# Patient Record
Sex: Female | Born: 1990 | Race: Black or African American | Hispanic: No | Marital: Single | Smoking: Former smoker
Health system: Southern US, Community
[De-identification: ages and names within clinical notes are randomized; demographics above are authoritative.]

## PROBLEM LIST (undated history)

## (undated) ENCOUNTER — Inpatient Hospital Stay (HOSPITAL_COMMUNITY): Payer: Self-pay

## (undated) DIAGNOSIS — N760 Acute vaginitis: Secondary | ICD-10-CM

## (undated) DIAGNOSIS — N926 Irregular menstruation, unspecified: Secondary | ICD-10-CM

## (undated) DIAGNOSIS — IMO0001 Reserved for inherently not codable concepts without codable children: Secondary | ICD-10-CM

## (undated) DIAGNOSIS — A539 Syphilis, unspecified: Secondary | ICD-10-CM

## (undated) DIAGNOSIS — Z8742 Personal history of other diseases of the female genital tract: Secondary | ICD-10-CM

## (undated) DIAGNOSIS — R102 Pelvic and perineal pain: Secondary | ICD-10-CM

## (undated) DIAGNOSIS — L299 Pruritus, unspecified: Secondary | ICD-10-CM

## (undated) DIAGNOSIS — Z8619 Personal history of other infectious and parasitic diseases: Secondary | ICD-10-CM

## (undated) DIAGNOSIS — B9689 Other specified bacterial agents as the cause of diseases classified elsewhere: Secondary | ICD-10-CM

## (undated) DIAGNOSIS — R634 Abnormal weight loss: Secondary | ICD-10-CM

## (undated) DIAGNOSIS — IMO0002 Reserved for concepts with insufficient information to code with codable children: Secondary | ICD-10-CM

## (undated) DIAGNOSIS — B977 Papillomavirus as the cause of diseases classified elsewhere: Secondary | ICD-10-CM

## (undated) HISTORY — DX: Other specified bacterial agents as the cause of diseases classified elsewhere: B96.89

## (undated) HISTORY — DX: Papillomavirus as the cause of diseases classified elsewhere: B97.7

## (undated) HISTORY — DX: Reserved for concepts with insufficient information to code with codable children: IMO0002

## (undated) HISTORY — DX: Reserved for inherently not codable concepts without codable children: IMO0001

## (undated) HISTORY — DX: Acute vaginitis: N76.0

## (undated) HISTORY — DX: Personal history of other diseases of the female genital tract: Z87.42

## (undated) HISTORY — DX: Abnormal weight loss: R63.4

## (undated) HISTORY — DX: Pruritus, unspecified: L29.9

## (undated) HISTORY — DX: Pelvic and perineal pain: R10.2

## (undated) HISTORY — DX: Personal history of other infectious and parasitic diseases: Z86.19

## (undated) HISTORY — DX: Syphilis, unspecified: A53.9

## (undated) HISTORY — DX: Irregular menstruation, unspecified: N92.6

---
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
## 2009-01-15 DIAGNOSIS — IMO0002 Reserved for concepts with insufficient information to code with codable children: Secondary | ICD-10-CM

## 2009-01-15 DIAGNOSIS — R87619 Unspecified abnormal cytological findings in specimens from cervix uteri: Secondary | ICD-10-CM

## 2009-01-15 HISTORY — DX: Unspecified abnormal cytological findings in specimens from cervix uteri: R87.619

## 2009-01-15 HISTORY — DX: Reserved for concepts with insufficient information to code with codable children: IMO0002

## 2009-06-05 DIAGNOSIS — Z8619 Personal history of other infectious and parasitic diseases: Secondary | ICD-10-CM

## 2009-06-05 HISTORY — DX: Personal history of other infectious and parasitic diseases: Z86.19

## 2009-09-04 DIAGNOSIS — L299 Pruritus, unspecified: Secondary | ICD-10-CM

## 2009-09-04 HISTORY — DX: Pruritus, unspecified: L29.9

## 2010-06-22 ENCOUNTER — Inpatient Hospital Stay (HOSPITAL_COMMUNITY): Admission: AD | Admit: 2010-06-22 | Discharge: 2010-06-22 | Payer: Self-pay | Admitting: Obstetrics and Gynecology

## 2010-07-26 ENCOUNTER — Inpatient Hospital Stay (HOSPITAL_COMMUNITY): Admission: AD | Admit: 2010-07-26 | Discharge: 2010-07-26 | Payer: Self-pay | Admitting: Obstetrics and Gynecology

## 2010-09-09 ENCOUNTER — Inpatient Hospital Stay (HOSPITAL_COMMUNITY): Admission: AD | Admit: 2010-09-09 | Discharge: 2010-09-09 | Payer: Self-pay | Admitting: Obstetrics and Gynecology

## 2010-09-10 ENCOUNTER — Inpatient Hospital Stay (HOSPITAL_COMMUNITY): Admission: AD | Admit: 2010-09-10 | Discharge: 2010-09-10 | Payer: Self-pay | Admitting: Obstetrics and Gynecology

## 2010-09-11 ENCOUNTER — Encounter (INDEPENDENT_AMBULATORY_CARE_PROVIDER_SITE_OTHER): Payer: Self-pay | Admitting: Obstetrics and Gynecology

## 2010-09-11 ENCOUNTER — Inpatient Hospital Stay (HOSPITAL_COMMUNITY): Admission: AD | Admit: 2010-09-11 | Discharge: 2010-09-13 | Payer: Self-pay | Admitting: Obstetrics and Gynecology

## 2011-02-27 LAB — COMPREHENSIVE METABOLIC PANEL
ALT: 28 U/L (ref 0–35)
Albumin: 3.1 g/dL — ABNORMAL LOW (ref 3.5–5.2)
CO2: 22 mEq/L (ref 19–32)
Creatinine, Ser: 0.61 mg/dL (ref 0.4–1.2)
GFR calc Af Amer: >60 mL/min (ref >60)
GFR calc non Af Amer: >60 mL/min (ref >60)
Sodium: 135 mEq/L (ref 135–145)
Total Protein: 5.7 g/dL — ABNORMAL LOW (ref 6.0–8.3)

## 2011-02-27 LAB — URINE CULTURE
Colony Count: NO GROWTH
Culture: NO GROWTH

## 2011-02-27 LAB — DIFFERENTIAL
Basophils Relative: 0 % (ref 0–1)
Eosinophils Relative: 1 % (ref 0–5)
Lymphocytes Relative: 7 % — ABNORMAL LOW (ref 12–46)
Lymphs Abs: 0.9 10*3/uL (ref 0.7–4.0)
Monocytes Relative: 9 % (ref 3–12)

## 2011-02-27 LAB — CBC
HCT: 27.6 % — ABNORMAL LOW (ref 36.0–46.0)
HCT: 33.8 % — ABNORMAL LOW (ref 36.0–46.0)
HCT: 34.1 % — ABNORMAL LOW (ref 36.0–46.0)
Hemoglobin: 9.6 g/dL — ABNORMAL LOW (ref 12.0–15.0)
MCH: 32.3 pg (ref 26.0–34.0)
MCH: 32.4 pg (ref 26.0–34.0)
MCH: 33.1 pg (ref 26.0–34.0)
MCHC: 34.7 g/dL (ref 30.0–36.0)
MCV: 94.9 fL (ref 78.0–100.0)
MCV: 95 fL (ref 78.0–100.0)
MCV: 95.6 fL (ref 78.0–100.0)
Platelets: 117 10*3/uL — ABNORMAL LOW (ref 150–400)
Platelets: 151 10*3/uL (ref 150–400)
RBC: 2.89 MIL/uL — ABNORMAL LOW (ref 3.87–5.11)
RDW: 13.2 % (ref 11.5–15.5)
RDW: 13.3 % (ref 11.5–15.5)

## 2011-02-27 LAB — URINALYSIS, ROUTINE W REFLEX MICROSCOPIC
Glucose, UA: NEGATIVE mg/dL
Protein, ur: NEGATIVE mg/dL
Urobilinogen, UA: 1 mg/dL (ref 0.0–1.0)

## 2011-02-27 LAB — WET PREP, GENITAL: Yeast Wet Prep HPF POC: NONE SEEN

## 2011-02-28 LAB — GC/CHLAMYDIA PROBE AMP, GENITAL
Chlamydia, DNA Probe: NEGATIVE
GC Probe Amp, Genital: NEGATIVE

## 2011-02-28 LAB — URINALYSIS, ROUTINE W REFLEX MICROSCOPIC
Bilirubin Urine: NEGATIVE
Hgb urine dipstick: NEGATIVE
Nitrite: NEGATIVE

## 2011-02-28 LAB — URINE MICROSCOPIC-ADD ON

## 2011-02-28 LAB — WET PREP, GENITAL
Trich, Wet Prep: NONE SEEN
Yeast Wet Prep HPF POC: NONE SEEN

## 2011-02-28 LAB — STREP B DNA PROBE: Strep Group B Ag: NEGATIVE

## 2011-03-02 LAB — WET PREP, GENITAL: Yeast Wet Prep HPF POC: NONE SEEN

## 2011-03-02 LAB — URINALYSIS, ROUTINE W REFLEX MICROSCOPIC
Ketones, ur: NEGATIVE mg/dL
Nitrite: NEGATIVE
pH: 6 (ref 5.0–8.0)

## 2011-03-02 LAB — URINE MICROSCOPIC-ADD ON

## 2011-03-02 LAB — GC/CHLAMYDIA PROBE AMP, GENITAL: GC Probe Amp, Genital: NEGATIVE

## 2011-09-08 LAB — URINE MICROSCOPIC-ADD ON

## 2011-09-08 LAB — URINALYSIS, ROUTINE W REFLEX MICROSCOPIC
Glucose, UA: NEGATIVE
Ketones, ur: NEGATIVE
Protein, ur: NEGATIVE

## 2011-09-08 LAB — CBC
HCT: 33.1 — ABNORMAL LOW
Hemoglobin: 11.3 — ABNORMAL LOW
MCHC: 34
RBC: 3.61 — ABNORMAL LOW
RDW: 12.5
WBC: 8.8

## 2011-09-08 LAB — DIFFERENTIAL
Basophils Absolute: 0
Basophils Relative: 0
Eosinophils Absolute: 0
Monocytes Relative: 6
Neutrophils Relative %: 86 — ABNORMAL HIGH

## 2011-09-08 LAB — BASIC METABOLIC PANEL
BUN: 8
CO2: 22
Glucose, Bld: 121 — ABNORMAL HIGH
Potassium: 3.8

## 2012-03-04 ENCOUNTER — Ambulatory Visit: Payer: Self-pay | Admitting: Obstetrics and Gynecology

## 2012-04-01 ENCOUNTER — Emergency Department (HOSPITAL_COMMUNITY)
Admission: EM | Admit: 2012-04-01 | Discharge: 2012-04-01 | Disposition: A | Discharge status: Home / Self Care | Payer: Managed Care, Other (non HMO) | Attending: Emergency Medicine | Admitting: Emergency Medicine

## 2012-04-01 ENCOUNTER — Encounter (HOSPITAL_COMMUNITY): Payer: Self-pay | Admitting: Emergency Medicine

## 2012-04-01 DIAGNOSIS — R1110 Vomiting, unspecified: Secondary | ICD-10-CM | POA: Insufficient documentation

## 2012-04-01 DIAGNOSIS — N946 Dysmenorrhea, unspecified: Secondary | ICD-10-CM | POA: Insufficient documentation

## 2012-04-01 DIAGNOSIS — R109 Unspecified abdominal pain: Secondary | ICD-10-CM | POA: Insufficient documentation

## 2012-04-01 LAB — BASIC METABOLIC PANEL
CO2: 25 mEq/L (ref 19–32)
GFR calc non Af Amer: >90 mL/min (ref >90)
Glucose, Bld: 88 mg/dL (ref 70–99)
Potassium: 3.8 mEq/L (ref 3.5–5.1)
Sodium: 139 mEq/L (ref 135–145)

## 2012-04-01 LAB — URINALYSIS, ROUTINE W REFLEX MICROSCOPIC
Nitrite: NEGATIVE
Specific Gravity, Urine: 1.028 (ref 1.005–1.030)
pH: 7.5 (ref 5.0–8.0)

## 2012-04-01 LAB — CBC
Platelets: 245 10*3/uL (ref 150–400)
RBC: 3.9 MIL/uL (ref 3.87–5.11)
WBC: 5.7 10*3/uL (ref 4.0–10.5)

## 2012-04-01 LAB — DIFFERENTIAL
Eosinophils Absolute: 0.1 10*3/uL (ref 0.0–0.7)
Lymphocytes Relative: 43 % (ref 12–46)
Lymphs Abs: 2.4 10*3/uL (ref 0.7–4.0)
Neutrophils Relative %: 47 % (ref 43–77)

## 2012-04-01 LAB — URINE MICROSCOPIC-ADD ON

## 2012-04-01 LAB — PREGNANCY, URINE: Preg Test, Ur: NEGATIVE

## 2012-04-01 MED ORDER — CIPROFLOXACIN HCL 500 MG PO TABS: 500.0000 mg | ORAL_TABLET | Freq: Two times a day (BID) | ORAL | Status: AC

## 2012-04-01 MED ORDER — ONDANSETRON 4 MG PO TBDP
4.0000 mg | ORAL_TABLET | Freq: Once | ORAL | Status: AC
  Administered 2012-04-01: 4 mg via ORAL
  Filled 2012-04-01: qty 1

## 2012-04-01 MED ORDER — OXYCODONE-ACETAMINOPHEN 5-325 MG PO TABS
1.0000 | ORAL_TABLET | Freq: Once | ORAL | Status: AC
  Administered 2012-04-01: 1 via ORAL
  Filled 2012-04-01: qty 1

## 2012-04-01 MED ORDER — ONDANSETRON 4 MG PO TBDP: 4.0000 mg | ORAL_TABLET | Freq: Three times a day (TID) | ORAL | Status: AC | PRN

## 2012-04-01 MED ORDER — OXYCODONE-ACETAMINOPHEN 5-325 MG PO TABS: 1.0000 | ORAL_TABLET | ORAL | Status: AC | PRN

## 2012-04-01 NOTE — Discharge Instructions (Signed)
Abdominal Pain Abdominal pain can be caused by many things. Your caregiver decides the seriousness of your pain by an examination and possibly blood tests and X-rays. Many cases can be observed and treated at home. Most abdominal pain is not caused by a disease and will probably improve without treatment. However, in many cases, more time must pass before a clear cause of the pain can be found. Before that point, it may not be known if you need more testing, or if hospitalization or surgery is needed. HOME CARE INSTRUCTIONS   Do not take laxatives unless directed by your caregiver.   Take pain medicine only as directed by your caregiver.   Only take over-the-counter or prescription medicines for pain, discomfort, or fever as directed by your caregiver.   Try a clear liquid diet (broth, tea, or water) for as long as directed by your caregiver. Slowly move to a bland diet as tolerated.  SEEK IMMEDIATE MEDICAL CARE IF:   The pain does not go away.   You have a fever.   You keep throwing up (vomiting).   The pain is felt only in portions of the abdomen. Pain in the right side could possibly be appendicitis. In an adult, pain in the left lower portion of the abdomen could be colitis or diverticulitis.   You pass bloody or black tarry stools.  MAKE SURE YOU:   Understand these instructions.   Will watch your condition.   Will get help right away if you are not doing well or get worse.  Document Released: 09/10/2005 Document Revised: 11/20/2011 Document Reviewed: 07/19/2008 ExitCare Patient Information 2012 ExitCare, LLCDysmenorrhea Menstrual pain is caused by the muscles of the uterus tightening (contracting) during a menstrual period. The muscles of the uterus contract due to the chemicals in the uterine lining. Primary dysmenorrhea is menstrual cramps that last a couple of days when you start having menstrual periods or soon after. This often begins after a teenager starts having her  period. As a woman gets older or has a baby, the cramps will usually lesson or disappear. Secondary dysmenorrhea begins later in life, lasts longer, and the pain may be stronger than primary dysmenorrhea. The pain may start before the period and last a few days after the period. This type of dysmenorrhea is usually caused by an underlying problem such as:  The tissue lining the uterus grows outside of the uterus in other areas of the body (endometriosis).   The endometrial tissue, which normally lines the uterus, is found in or grows into the muscular walls of the uterus (adenomyosis).   The pelvic blood vessels are engorged with blood just before the menstrual period (pelvic congestive syndrome).   Overgrowth of cells in the lining of the uterus or cervix (polyps of the uterus or cervix).   Falling down of the uterus (prolapse) because of loose or stretched ligaments.   Depression.   Bladder problems, infection, or inflammation.   Problems with the intestine, a tumor, or irritable bowel syndrome.   Cancer of the female organs or bladder.   A severely tipped uterus.   A very tight opening or closed cervix.   Noncancerous tumors of the uterus (fibroids).   Pelvic inflammatory disease (PID).   Pelvic scarring (adhesions) from a previous surgery.   Ovarian cyst.   An intrauterine device (IUD) used for birth control.  CAUSES  The cause of menstrual pain is often unknown. SYMPTOMS   Cramping or throbbing pain in your lower abdomen.  Sometimes, a woman may also experience headaches.   Lower back pain.   Feeling sick to your stomach (nausea) or vomiting.   Diarrhea.   Sweating or dizziness.  DIAGNOSIS  A diagnosis is based on your history, symptoms, physical examination, diagnostic tests, or procedures. Diagnostic tests or procedures may include:  Blood tests.   An ultrasound.   An examination of the lining of the uterus (dilation and curettage, D&C).   An  examination inside your abdomen or pelvis with a scope (laparoscopy).   X-rays.   CT Scan.   MRI.   An examination inside the bladder with a scope (cystoscopy).   An examination inside the intestine or stomach with a scope (colonoscopy, gastroscopy).  TREATMENT  Treatment depends on the cause of the dysmenorrhea. Treatment may include:  Pain medicine prescribed by your caregiver.   Birth control pills.   Hormone replacement therapy.   Nonsteroidal anti-inflammatory drugs (NSAIDs). These may help stop the production of prostaglandins.   An IUD with progesterone hormone in it.   Acupuncture.   Surgery to remove adhesions, endometriosis, ovarian cyst, or fibroids.   Removal of the uterus (hysterectomy).   Progesterone shots to stop the menstrual period.   Cutting the nerves on the sacrum that go to the female organs (presacral neurectomy).   Electric currant to the sacral nerves (sacral nerve stimulation).   Antidepressant medicine.   Psychiatric therapy, counseling, or group therapy.   Exercise and physical therapy.   Meditation and yoga therapy.  HOME CARE INSTRUCTIONS   Only take over-the-counter or prescription medicines for pain, discomfort, or fever as directed by your caregiver.   Place a heating pad or hot water bottle on your lower back or abdomen. Do not sleep with the heating pad.   Use aerobic exercises, walking, swimming, biking, and other exercises to help lessen the cramping.   Massage to the lower back or abdomen may help.   Stop smoking.   Avoid alcohol and caffeine.   Yoga, meditation, or acupuncture may help.  SEEK MEDICAL CARE IF:   The pain does not get better with medicine.   You have pain with sexual intercourse.  SEEK IMMEDIATE MEDICAL CARE IF:   Your pain increases and is not controlled with medicines.   You have a fever.   You develop nausea or vomiting with your period not controlled with medicine.   You have abnormal  vaginal bleeding with your period.   You pass out.  MAKE SURE YOU:   Understand these instructions.   Will watch your condition.   Will get help right away if you are not doing well or get worse.  Document Released: 12/01/2005 Document Revised: 11/20/2011 Document Reviewed: 03/19/2009 [facility] Patient Information 2012 [place], [place].Marland Kitchen

## 2012-04-01 NOTE — ED Notes (Signed)
Pt c/o lower abd pain x 3 days, pt states she was seen at [facility] on Monday, tested for STD d/t vaginal discharge and itching. Pt did have neg preg test at that time. Pt states now pain worse with heavy vaginal bleeding and vomiting.

## 2012-04-01 NOTE — ED Notes (Signed)
Pt was seen on Monday for STD check, blood drawn, pt put on antibiotics "just in case she had a bacterial infection." pt reports she has had mirena for the last 2 years, lightly bled last week, today bleeding started and was heavier. Pt reports unprotected sex 2 weeks ago. 1 week ago pt started having burning, itching, and vaginal discharge. At present pt reports pain 7/10 in lower abdomen.

## 2012-04-01 NOTE — ED Provider Notes (Signed)
History     CSN: [id number]  Arrival date & time 04/01/12  1647   First MD Initiated Contact with Patient 04/01/12 1707      Chief Complaint  Patient presents with  . Abdominal Pain    (Consider location/radiation/quality/duration/timing/severity/associated sxs/prior treatment) HPI  Pt presents to the ED with complaints of abdominal pain for 7 months. She was seen yesterday at [facility] for the same and treated with flagyl, Azithromycin and rocephin. She states that she has been taking the medications but they are not staying down because she is vomiting. She denies getting anything for the pain but admits that her stomach usually acts up when she takes pills any ways. She does not know the status of her urine preg. She has an abdominal ultrasound and thyroid ultrasound scheduled for this up coming Tuesday for evaluation of her chronic abdominal pain. The patient informs me that she uses [employer] and still gets break through periods. She feels that today her period is heavier than normal as she has used two tampons in the last couple of hours. She denies having discharge still and denies dysuria or diarrhea.  History reviewed. No pertinent past medical history.  History reviewed. No pertinent past surgical history.  No family history on file.  History  Substance Use Topics  . Smoking status: Former Games developer  . Smokeless tobacco: Not on file  . Alcohol Use: No    OB History    Grav Para Term Preterm Abortions TAB SAB Ect Mult Living                  Review of Systems  All other systems reviewed and are negative.    Allergies  Review of patient's allergies indicates no known allergies.  Home Medications   Current Outpatient Rx  Name Route Sig Dispense Refill  . IBUPROFEN 200 MG PO TABS Oral Take 200 mg by mouth every 8 (eight) hours as needed. For pain.    Marland Kitchen METRONIDAZOLE 0.75 % VA GEL Vaginal Place 1 Applicatorful vaginally at bedtime. 5 day course of therapy;  started Monday 4/15    . CIPROFLOXACIN HCL 250 MG PO TABS Oral Take 250 mg by mouth 2 (two) times daily.    Marland Kitchen CIPROFLOXACIN HCL 500 MG PO TABS Oral Take 1 tablet (500 mg total) by mouth 2 (two) times daily. 6 tablet 0  . ONDANSETRON 4 MG PO TBDP Oral Take 1 tablet (4 mg total) by mouth every 8 (eight) hours as needed for nausea. 20 tablet 0  . OXYCODONE-ACETAMINOPHEN 5-325 MG PO TABS Oral Take 1 tablet by mouth every 4 (four) hours as needed for pain. 10 tablet 0    BP 116/65  Pulse 67  Temp 99 F (37.2 C)  Resp 16  SpO2 100%  LMP 04/01/2012  Physical Exam  Nursing note and vitals reviewed. Constitutional: She appears well-developed and well-nourished. No distress.  HENT:  Head: Normocephalic and atraumatic.  Eyes: Pupils are equal, round, and reactive to light.  Neck: Normal range of motion. Neck supple.  Cardiovascular: Normal rate and regular rhythm.   Pulmonary/Chest: Effort normal. Tenderness: diffuse mild tenderness to palpation.  Abdominal: Soft. She exhibits no distension. There is tenderness (diffuse  very mild and vague tenderenss to palpation that moves.). There is no rebound and no guarding.  Neurological: She is alert.  Skin: Skin is warm and dry.    ED Course  Procedures (including critical care time)  Labs Reviewed  URINALYSIS, ROUTINE W REFLEX  MICROSCOPIC - Abnormal; Notable for the following:    Leukocytes, UA SMALL (*)    All other components within normal limits  URINE MICROSCOPIC-ADD ON - Abnormal; Notable for the following:    Squamous Epithelial / LPF MANY (*)    All other components within normal limits  PREGNANCY, URINE  CBC  DIFFERENTIAL  BASIC METABOLIC PANEL   No results found.   1. Abdominal pain   2. Dysmenorrhea       MDM  Pt has good follow-up and ultrasound study coming up this Tuesday. She see's [facility]. S Her pain was treated in the ED and managed with Zofran and 1 Percocet. Will give Rx for the same and refer her back to her  GI. She has also been given referral to [facility] clinic due to increased bleeding intermittently on the [place].  I will also advise her to keep her appointment this Tuesday. The patient hemoglobin is stable with out any acute drop in levels. Urine preg negative. Pt has not had to re change her tampon for her period since here in the ED. No longer having pain and passed PO challenge.    Pt has been advised of the symptoms that warrant their return to the ED. Patient has voiced understanding and has agreed to follow-up with the PCP or specialist.         Dorthula Matas, PA 04/01/12 1940

## 2012-04-02 NOTE — ED Provider Notes (Signed)
Medical screening examination/treatment/procedure(s) were performed by non-physician practitioner and as supervising physician I was immediately available for consultation/collaboration.   Nat Christen, MD 04/02/12 [id number]

## 2012-06-02 ENCOUNTER — Telehealth: Payer: Self-pay | Admitting: Obstetrics and Gynecology

## 2012-06-03 ENCOUNTER — Encounter: Payer: Self-pay | Admitting: Obstetrics and Gynecology

## 2012-06-30 ENCOUNTER — Encounter: Payer: Managed Care, Other (non HMO) | Admitting: Obstetrics and Gynecology

## 2012-07-06 ENCOUNTER — Ambulatory Visit (INDEPENDENT_AMBULATORY_CARE_PROVIDER_SITE_OTHER): Payer: Managed Care, Other (non HMO) | Admitting: Obstetrics and Gynecology

## 2012-07-06 ENCOUNTER — Encounter: Payer: Self-pay | Admitting: Obstetrics and Gynecology

## 2012-07-06 VITALS — BP 112/70 | Wt 169.0 lb

## 2012-07-06 DIAGNOSIS — N898 Other specified noninflammatory disorders of vagina: Secondary | ICD-10-CM

## 2012-07-06 DIAGNOSIS — R300 Dysuria: Secondary | ICD-10-CM

## 2012-07-06 LAB — POCT WET PREP (WET MOUNT): Clue Cells Wet Prep Whiff POC: NEGATIVE

## 2012-07-06 LAB — POCT URINALYSIS DIPSTICK

## 2012-07-06 LAB — POCT OSOM TRICHOMONAS RAPID TEST: Trichomonas vaginalis: NEGATIVE

## 2012-07-06 MED ORDER — METRONIDAZOLE 0.75 % VA GEL: 1.0000 | Freq: Two times a day (BID) | VAGINAL | Status: AC

## 2012-07-06 MED ORDER — METRONIDAZOLE 500 MG PO TABS: 500.0000 mg | ORAL_TABLET | ORAL | Status: DC

## 2012-07-06 MED ORDER — METRONIDAZOLE 500 MG PO TABS: 500.0000 mg | ORAL_TABLET | ORAL | Status: AC

## 2012-07-06 NOTE — Progress Notes (Signed)
RGYN PROBLEM, VAGINAL DISCHARGE  SUBJECTIVE:Color: white  Odor: yes  Off and on Itching:yes Thin:yes Thick:no Fever:no Dyspareunia:yes Hx PID:no HX STD:yes + gc/ct 2011 Pelvic Pain:yes Desires Gc/CT:no  Screening done x 1 month ago Desires HIV,RPR,HbsAG:no    OBJECTIVE:BP 112/70  Wt 169 lb (76.658 kg)   Pelvic exam:   , VULVA: normal appearing vulva with no masses, tenderness or lesions, VAGINA: normal appearing vagina with normal color and discharge, no lesions, vaginal discharge - creamy, bloody and mucoid, CERVIX: cervical motion tenderness absent, IUD string present, UTERUS: uterus is normal size, shape, consistency and nontender, ADNEXA: normal adnexa in size, nontender and no masses  OSOM BV:  Neg OSOM TRICH:  Neg WET PREP:  Neg   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

## 2012-07-06 NOTE — Patient Instructions (Signed)
Bacterial Vaginosis Bacterial vaginosis (BV) is a vaginal infection where the normal balance of bacteria in the vagina is disrupted. The normal balance is then replaced by an overgrowth of certain bacteria. There are several different kinds of bacteria that can cause BV. BV is the most common vaginal infection in women of childbearing age. CAUSES   The cause of BV is not fully understood. BV develops when there is an increase or imbalance of harmful bacteria.   Some activities or behaviors can upset the normal balance of bacteria in the vagina and put women at increased risk including:   Having a new sex partner or multiple sex partners.   Douching.   Using an intrauterine device (IUD) for contraception.   It is not clear what role sexual activity plays in the development of BV. However, women that have never had sexual intercourse are rarely infected with BV.  Women do not get BV from toilet seats, bedding, swimming pools or from touching objects around them.  SYMPTOMS   Grey vaginal discharge.   A fish-like odor with discharge, especially after sexual intercourse.   Itching or burning of the vagina and vulva.   Burning or pain with urination.   Some women have no signs or symptoms at all.  DIAGNOSIS  Your caregiver must examine the vagina for signs of BV. Your caregiver will perform lab tests and look at the sample of vaginal fluid through a microscope. They will look for bacteria and abnormal cells (clue cells), a pH test higher than 4.5, and a positive amine test all associated with BV.  RISKS AND COMPLICATIONS   Pelvic inflammatory disease (PID).   Infections following gynecology surgery.   Developing HIV.   Developing herpes virus.  TREATMENT  Sometimes BV will clear up without treatment. However, all women with symptoms of BV should be treated to avoid complications, especially if gynecology surgery is planned. Female partners generally do not need to be treated. However,  BV may spread between female sex partners so treatment is helpful in preventing a recurrence of BV.   BV may be treated with antibiotics. The antibiotics come in either pill or vaginal cream forms. Either can be used with nonpregnant or pregnant women, but the recommended dosages differ. These antibiotics are not harmful to the baby.   BV can recur after treatment. If this happens, a second round of antibiotics will often be prescribed.   Treatment is important for pregnant women. If not treated, BV can cause a premature delivery, especially for a pregnant woman who had a premature birth in the past. All pregnant women who have symptoms of BV should be checked and treated.   For chronic reoccurrence of BV, treatment with a type of prescribed gel vaginally twice a week is helpful.  HOME CARE INSTRUCTIONS   Finish all medication as directed by your caregiver.   Do not have sex until treatment is completed.   Tell your sexual partner that you have a vaginal infection. They should see their caregiver and be treated if they have problems, such as a mild rash or itching.   Practice safe sex. Use condoms. Only have 1 sex partner.  PREVENTION  Basic prevention steps can help reduce the risk of upsetting the natural balance of bacteria in the vagina and developing BV:  Do not have sexual intercourse (be abstinent).   Do not douche.   Use all of the medicine prescribed for treatment of BV, even if the signs and symptoms go away.     Tell your sex partner if you have BV. That way, they can be treated, if needed, to prevent reoccurrence.  SEEK MEDICAL CARE IF:   Your symptoms are not improving after 3 days of treatment.   You have increased discharge, pain, or fever.  MAKE SURE YOU:   Understand these instructions.   Will watch your condition.   Will get help right away if you are not doing well or get worse.  FOR MORE INFORMATION  Division of STD Prevention (DSTDP), Centers for Disease  Control and Prevention: www.cdc.gov/std American Social Health Association (ASHA): www.ashastd.org  Document Released: 12/01/2005 Document Revised: 11/20/2011 Document Reviewed: 05/24/2009 Exit[employer] Patient Information 2012 [employer]. 

## 2012-07-07 LAB — GC/CHLAMYDIA PROBE AMP, GENITAL: Chlamydia, DNA Probe: NEGATIVE

## 2012-08-23 ENCOUNTER — Telehealth: Payer: Self-pay | Admitting: Obstetrics and Gynecology

## 2012-08-23 NOTE — Telephone Encounter (Signed)
Spoke with pt rgd msg pt c/o boil on her breast very painful offered pt and appt 08/24/12 pt declined appt pt states will go to urgent care

## 2012-08-23 NOTE — Telephone Encounter (Signed)
TRIAGE/APPT REQ. °

## 2012-08-24 ENCOUNTER — Other Ambulatory Visit: Payer: Self-pay | Admitting: Family Medicine

## 2012-08-24 ENCOUNTER — Ambulatory Visit
Admission: RE | Admit: 2012-08-24 | Discharge: 2012-08-24 | Disposition: A | Discharge status: Home / Self Care | Payer: Managed Care, Other (non HMO) | Source: Ambulatory Visit | Attending: Family Medicine | Admitting: Family Medicine

## 2012-08-24 DIAGNOSIS — N63 Unspecified lump in unspecified breast: Secondary | ICD-10-CM

## 2012-08-24 DIAGNOSIS — N644 Mastodynia: Secondary | ICD-10-CM

## 2013-01-03 ENCOUNTER — Emergency Department (INDEPENDENT_AMBULATORY_CARE_PROVIDER_SITE_OTHER)
Admission: EM | Admit: 2013-01-03 | Discharge: 2013-01-03 | Disposition: A | Discharge status: Home / Self Care | Payer: Managed Care, Other (non HMO) | Source: Home / Self Care | Attending: Emergency Medicine | Admitting: Emergency Medicine

## 2013-01-03 ENCOUNTER — Emergency Department (INDEPENDENT_AMBULATORY_CARE_PROVIDER_SITE_OTHER): Payer: Managed Care, Other (non HMO)

## 2013-01-03 ENCOUNTER — Encounter (HOSPITAL_COMMUNITY): Payer: Self-pay | Admitting: *Deleted

## 2013-01-03 DIAGNOSIS — J111 Influenza due to unidentified influenza virus with other respiratory manifestations: Secondary | ICD-10-CM

## 2013-01-03 MED ORDER — TRAMADOL HCL 50 MG PO TABS: 100.0000 mg | ORAL_TABLET | Freq: Three times a day (TID) | ORAL | Status: DC | PRN

## 2013-01-03 MED ORDER — IBUPROFEN 800 MG PO TABS
800.0000 mg | ORAL_TABLET | Freq: Once | ORAL | Status: AC
  Administered 2013-01-03: 800 mg via ORAL

## 2013-01-03 MED ORDER — ONDANSETRON 8 MG PO TBDP: 8.0000 mg | ORAL_TABLET | Freq: Three times a day (TID) | ORAL | Status: DC | PRN

## 2013-01-03 MED ORDER — IBUPROFEN 800 MG PO TABS
ORAL_TABLET | ORAL | Status: AC
  Filled 2013-01-03: qty 1

## 2013-01-03 MED ORDER — OSELTAMIVIR PHOSPHATE 75 MG PO CAPS: 75.0000 mg | ORAL_CAPSULE | Freq: Two times a day (BID) | ORAL | Status: DC

## 2013-01-03 MED ORDER — HYDROCOD POLST-CHLORPHEN POLST 10-8 MG/5ML PO LQCR: 5.0000 mL | Freq: Two times a day (BID) | ORAL | Status: DC | PRN

## 2013-01-03 NOTE — ED Notes (Signed)
C/o fever, cough, body aches, nausea and vomiting onset yesterday. V x 9 x yesterday and today.  No diarrhea.  LD Dayquil with Tylenol was @ 1500.

## 2013-01-03 NOTE — ED Provider Notes (Signed)
Chief Complaint  Patient presents with  . Fever    History of Present Illness:   VALI CAPANO  is a 22 year old female who has had a two-day history of dry cough, wheezing, chest pain, fever, chills, aches, headache, nausea, vomiting, and sore throat. She is an ex-smoker and has no history of asthma. She has been exposed her infant daughter who has influenza-like symptoms. She has not gotten the flu vaccine.  Review of Systems:  Other than noted above, the patient denies any of the following symptoms. Systemic:  No fever, chills, sweats, fatigue, myalgias, headache, or anorexia. Eye:  No redness, pain or drainage. ENT:  No earache, ear congestion, nasal congestion, sneezing, rhinorrhea, sinus pressure, sinus pain, post nasal drip, or sore throat. Lungs:  No cough, sputum production, wheezing, shortness of breath, or chest pain. GI:  No abdominal pain, nausea, vomiting, or diarrhea.  PMFSH:  Past medical history, family history, social history, meds, and allergies were reviewed.  Physical Exam:   Vital signs:  BP 138/85  Pulse 130  Temp 102.6 F (39.2 C) (Oral)  Resp 22  SpO2 100% General:  Alert, in no distress. Eye:  No conjunctival injection or drainage. Lids were normal. ENT:  TMs and canals were normal, without erythema or inflammation.  Nasal mucosa was clear and uncongested, without drainage.  Mucous membranes were moist.  Pharynx was clear, without exudate or drainage.  There were no oral ulcerations or lesions. Neck:  Supple, no adenopathy, tenderness or mass. Lungs:  No respiratory distress.  Lungs were clear to auscultation, without wheezes, rales or rhonchi.  Breath sounds were clear and equal bilaterally.  Heart:  Regular rhythm, without gallops, murmers or rubs. Skin:  Clear, warm, and dry, without rash or lesions.  Radiology:  Dg Chest 2 View  01/03/2013  *RADIOLOGY REPORT*  Clinical Data: Cough and fever  CHEST - 2 VIEW  Comparison:  None.  Findings:  The heart size  and mediastinal contours are within normal limits.  Both lungs are clear.  The visualized skeletal structures are unremarkable.  IMPRESSION: No active cardiopulmonary disease.   Original Report Authenticated By: Myles Rosenthal, M.D.    I reviewed the images independently and personally and concur with the radiologist's findings.  Assessment:  The encounter diagnosis was Influenza-like illness.  Plan:   1.  The following meds were prescribed:   New Prescriptions   CHLORPHENIRAMINE-HYDROCODONE (TUSSIONEX) 10-8 MG/5ML LQCR    Take 5 mLs by mouth every 12 (twelve) hours as needed.   OSELTAMIVIR (TAMIFLU) 75 MG CAPSULE    Take 1 capsule (75 mg total) by mouth every 12 (twelve) hours.   TRAMADOL (ULTRAM) 50 MG TABLET    Take 2 tablets (100 mg total) by mouth every 8 (eight) hours as needed for pain.   2.  The patient was instructed in symptomatic care and handouts were given. 3.  The patient was told to return if becoming worse in any way, if no better in 3 or 4 days, and given some red flag symptoms that would indicate earlier return.   Reuben Likes, MD 01/03/13 1950

## 2013-01-27 ENCOUNTER — Encounter: Payer: Managed Care, Other (non HMO) | Admitting: Obstetrics and Gynecology

## 2013-02-20 ENCOUNTER — Emergency Department (HOSPITAL_COMMUNITY)
Admission: EM | Admit: 2013-02-20 | Discharge: 2013-02-20 | Disposition: A | Discharge status: Home / Self Care | Payer: Managed Care, Other (non HMO) | Attending: Emergency Medicine | Admitting: Emergency Medicine

## 2013-02-20 ENCOUNTER — Encounter (HOSPITAL_COMMUNITY): Payer: Self-pay | Admitting: Emergency Medicine

## 2013-02-20 DIAGNOSIS — R197 Diarrhea, unspecified: Secondary | ICD-10-CM | POA: Insufficient documentation

## 2013-02-20 DIAGNOSIS — K529 Noninfective gastroenteritis and colitis, unspecified: Secondary | ICD-10-CM

## 2013-02-20 DIAGNOSIS — R52 Pain, unspecified: Secondary | ICD-10-CM | POA: Insufficient documentation

## 2013-02-20 DIAGNOSIS — Z87891 Personal history of nicotine dependence: Secondary | ICD-10-CM | POA: Insufficient documentation

## 2013-02-20 DIAGNOSIS — Q899 Congenital malformation, unspecified: Secondary | ICD-10-CM | POA: Insufficient documentation

## 2013-02-20 DIAGNOSIS — Z8742 Personal history of other diseases of the female genital tract: Secondary | ICD-10-CM | POA: Insufficient documentation

## 2013-02-20 DIAGNOSIS — R5381 Other malaise: Secondary | ICD-10-CM | POA: Insufficient documentation

## 2013-02-20 DIAGNOSIS — M545 Low back pain, unspecified: Secondary | ICD-10-CM | POA: Insufficient documentation

## 2013-02-20 DIAGNOSIS — Z8619 Personal history of other infectious and parasitic diseases: Secondary | ICD-10-CM | POA: Insufficient documentation

## 2013-02-20 DIAGNOSIS — Z872 Personal history of diseases of the skin and subcutaneous tissue: Secondary | ICD-10-CM | POA: Insufficient documentation

## 2013-02-20 DIAGNOSIS — N898 Other specified noninflammatory disorders of vagina: Secondary | ICD-10-CM | POA: Insufficient documentation

## 2013-02-20 DIAGNOSIS — K5289 Other specified noninfective gastroenteritis and colitis: Secondary | ICD-10-CM | POA: Insufficient documentation

## 2013-02-20 DIAGNOSIS — Z3202 Encounter for pregnancy test, result negative: Secondary | ICD-10-CM | POA: Insufficient documentation

## 2013-02-20 DIAGNOSIS — Z79899 Other long term (current) drug therapy: Secondary | ICD-10-CM | POA: Insufficient documentation

## 2013-02-20 DIAGNOSIS — R1084 Generalized abdominal pain: Secondary | ICD-10-CM | POA: Insufficient documentation

## 2013-02-20 LAB — URINALYSIS, MICROSCOPIC ONLY
Leukocytes, UA: NEGATIVE
Protein, ur: NEGATIVE mg/dL
Specific Gravity, Urine: 1.028 (ref 1.005–1.030)
Urobilinogen, UA: 0.2 mg/dL (ref 0.0–1.0)

## 2013-02-20 LAB — COMPREHENSIVE METABOLIC PANEL
AST: 20 U/L (ref 0–37)
CO2: 23 mEq/L (ref 19–32)
Calcium: 8.8 mg/dL (ref 8.4–10.5)
Creatinine, Ser: 0.83 mg/dL (ref 0.50–1.10)
GFR calc Af Amer: >90 mL/min (ref >90)
GFR calc non Af Amer: >90 mL/min (ref >90)
Glucose, Bld: 111 mg/dL — ABNORMAL HIGH (ref 70–99)
Sodium: 136 mEq/L (ref 135–145)
Total Protein: 7.5 g/dL (ref 6.0–8.3)

## 2013-02-20 LAB — CBC WITH DIFFERENTIAL/PLATELET
Basophils Absolute: 0 10*3/uL (ref 0.0–0.1)
Eosinophils Absolute: 0 10*3/uL (ref 0.0–0.7)
Eosinophils Relative: 0 % (ref 0–5)
HCT: 38.5 % (ref 36.0–46.0)
Lymphocytes Relative: 6 % — ABNORMAL LOW (ref 12–46)
MCH: 30.6 pg (ref 26.0–34.0)
MCHC: 33.5 g/dL (ref 30.0–36.0)
MCV: 91.4 fL (ref 78.0–100.0)
Monocytes Absolute: 0.4 10*3/uL (ref 0.1–1.0)
Platelets: 217 10*3/uL (ref 150–400)
RDW: 12.9 % (ref 11.5–15.5)
WBC: 6.8 10*3/uL (ref 4.0–10.5)

## 2013-02-20 LAB — WET PREP, GENITAL

## 2013-02-20 LAB — POCT PREGNANCY, URINE: Preg Test, Ur: NEGATIVE

## 2013-02-20 MED ORDER — SODIUM CHLORIDE 0.9 % IV BOLUS (SEPSIS)
1000.0000 mL | Freq: Once | INTRAVENOUS | Status: AC
  Administered 2013-02-20: 1000 mL via INTRAVENOUS

## 2013-02-20 MED ORDER — SODIUM CHLORIDE 0.9 % IV BOLUS (SEPSIS): 1000.0000 mL | Freq: Once | INTRAVENOUS | Status: DC

## 2013-02-20 MED ORDER — ONDANSETRON 4 MG PO TBDP
4.0000 mg | ORAL_TABLET | Freq: Once | ORAL | Status: AC
  Administered 2013-02-20: 4 mg via ORAL
  Filled 2013-02-20: qty 1

## 2013-02-20 MED ORDER — ACETAMINOPHEN 325 MG PO TABS
650.0000 mg | ORAL_TABLET | Freq: Once | ORAL | Status: AC
  Administered 2013-02-20: 650 mg via ORAL
  Filled 2013-02-20: qty 2

## 2013-02-20 MED ORDER — ONDANSETRON HCL 4 MG PO TABS: 4.0000 mg | ORAL_TABLET | Freq: Four times a day (QID) | ORAL | Status: DC

## 2013-02-20 MED ORDER — ONDANSETRON HCL 4 MG/2ML IJ SOLN
4.0000 mg | Freq: Once | INTRAMUSCULAR | Status: AC
  Administered 2013-02-20: 4 mg via INTRAVENOUS
  Filled 2013-02-20: qty 2

## 2013-02-20 MED ORDER — PANTOPRAZOLE SODIUM 40 MG IV SOLR
40.0000 mg | Freq: Once | INTRAVENOUS | Status: AC
  Administered 2013-02-20: 40 mg via INTRAVENOUS
  Filled 2013-02-20: qty 40

## 2013-02-20 NOTE — ED Notes (Signed)
Pt c/o NVD since 4 am today.  Fever/chills.  States her whole body aches.  Pain 10/10.

## 2013-02-20 NOTE — ED Provider Notes (Signed)
History     CSN: [id number]  Arrival date & time 02/20/13  1644   First MD Initiated Contact with Patient 02/20/13 1901      Chief Complaint  Patient presents with  . Nausea  . Emesis  . Diarrhea  . Generalized Body Aches    (Consider location/radiation/quality/duration/timing/severity/associated sxs/prior treatment) HPI Comments: Patient presents with nausea, vomiting, diarrhea, abdominal pain, body aches since 4 AM this morning. Symptoms started after eating a hamburger. Denies fever h with the patient andas temp 100.4 here.  No urinary symptoms. She denies any vaginal bleeding. She is "light" vaginal discharge. She complains of lower back pain diffuse abdominal pain. Denies any urinary symptoms.  The history is provided by the patient.    Past Medical History  Diagnosis Date  . HPV in female   . Syphilis 2006  . Abnormal Pap smear 01/2009  . BV (bacterial vaginosis) 2010  . H/O cervicitis 2010  . Pelvic pain in female 2010  . Dysplasia 2010  . History of chlamydia 06/05/09  . Itching 09/04/2009  . Weight loss   . H/O gonorrhea 2010  . Irregular periods/menstrual cycles   . H/O varicella     No past surgical history on file.  Family History  Problem Relation Age of Onset  . Drug abuse Father   . COPD Maternal Grandmother     Emphysema    History  Substance Use Topics  . Smoking status: Former Games developer  . Smokeless tobacco: Not on file  . Alcohol Use: No    OB History   Grav Para Term Preterm Abortions TAB SAB Ect Mult Living   1 1 1       1       Review of Systems  Constitutional: Negative for activity change and appetite change.  HENT: Negative for congestion and rhinorrhea.   Eyes: Negative for visual disturbance.  Respiratory: Negative for cough, chest tightness and shortness of breath.   Gastrointestinal: Positive for vomiting, abdominal pain and diarrhea. Negative for constipation.  Endocrine: Negative for polyuria.  Genitourinary: Negative for  dysuria, hematuria, vaginal bleeding and vaginal discharge.  Musculoskeletal: Negative for back pain.  Skin: Negative for rash.  Neurological: Positive for weakness. Negative for dizziness and headaches.  A complete 10 system review of systems was obtained and all systems are negative except as noted in the HPI and PMH.    Allergies  Review of patient's allergies indicates no known allergies.  Home Medications   Current Outpatient Rx  Name  Route  Sig  Dispense  Refill  . ibuprofen (ADVIL,MOTRIN) 200 MG tablet   Oral   Take 400 mg by mouth every 8 (eight) hours as needed. For pain.         Marland Kitchen levonorgestrel (MIRENA) 20 MCG/24HR IUD   Intrauterine   1 each by Intrauterine route once.         . ondansetron (ZOFRAN-ODT) 4 MG disintegrating tablet   Oral   Take 4 mg by mouth every 8 (eight) hours as needed for nausea.         . ondansetron (ZOFRAN) 4 MG tablet   Oral   Take 1 tablet (4 mg total) by mouth every 6 (six) hours.   12 tablet   0     BP 112/68  Pulse 108  Temp(Src) 99.1 F (37.3 C) (Oral)  Resp 20  SpO2 100%  Physical Exam  Constitutional: She is oriented to person, place, and time. She appears well-developed and well-nourished.  No distress.  HENT:  Head: Normocephalic and atraumatic.  Mouth/Throat: Oropharynx is clear and moist. No oropharyngeal exudate.  Eyes: Conjunctivae are normal. Pupils are equal, round, and reactive to light.  Neck: Normal range of motion. Neck supple.  Cardiovascular: Normal rate, regular rhythm and normal heart sounds.   Pulmonary/Chest: Effort normal and breath sounds normal. No respiratory distress.  Abdominal: Soft. There is tenderness. There is no rebound and no guarding.  Soft, minimal tenderness  Musculoskeletal: Normal range of motion. She exhibits no edema and no tenderness.  No CVAT  Neurological: She is alert and oriented to person, place, and time. No cranial nerve deficit. She exhibits normal muscle tone.  Coordination normal.  Skin: Skin is warm.    ED Course  Procedures (including critical care time)  Labs Reviewed  WET PREP, GENITAL - Abnormal; Notable for the following:    WBC, Wet Prep HPF POC FEW (*)    All other components within normal limits  CBC WITH DIFFERENTIAL - Abnormal; Notable for the following:    Neutrophils Relative 88 (*)    Lymphocytes Relative 6 (*)    Lymphs Abs 0.4 (*)    All other components within normal limits  COMPREHENSIVE METABOLIC PANEL - Abnormal; Notable for the following:    Glucose, Bld 111 (*)    All other components within normal limits  GC/CHLAMYDIA PROBE AMP  LIPASE, BLOOD  URINALYSIS, MICROSCOPIC ONLY  POCT PREGNANCY, URINE   No results found.   1. Gastroenteritis       MDM  15 hours of nausea, vomiting, diarrhea with fever and abdominal pain.  Started after eating hamburger.  Vitals stable. Abdomen soft and nonsurgical.  Pelvic exam is benign. Patient's urinalysis is negative. Her abdomen is soft and nontender.  Suspect viral gastroenteritis in setting of hamburger yesterday. No vomiting in the ED, tolerating by mouth. Will discharge with antiemetics and return precautions.   Glynn Octave, MD 02/21/13 0002

## 2013-03-20 LAB — OB RESULTS CONSOLE GC/CHLAMYDIA: CHLAMYDIA, DNA PROBE: NEGATIVE

## 2013-06-23 ENCOUNTER — Emergency Department (HOSPITAL_COMMUNITY): Payer: Managed Care, Other (non HMO)

## 2013-06-23 ENCOUNTER — Encounter (HOSPITAL_COMMUNITY): Payer: Self-pay | Admitting: Emergency Medicine

## 2013-06-23 ENCOUNTER — Emergency Department (HOSPITAL_COMMUNITY)
Admission: EM | Admit: 2013-06-23 | Discharge: 2013-06-23 | Disposition: A | Discharge status: Home / Self Care | Payer: Managed Care, Other (non HMO) | Attending: Emergency Medicine | Admitting: Emergency Medicine

## 2013-06-23 DIAGNOSIS — R509 Fever, unspecified: Secondary | ICD-10-CM | POA: Insufficient documentation

## 2013-06-23 DIAGNOSIS — Z8619 Personal history of other infectious and parasitic diseases: Secondary | ICD-10-CM | POA: Insufficient documentation

## 2013-06-23 DIAGNOSIS — B9789 Other viral agents as the cause of diseases classified elsewhere: Secondary | ICD-10-CM | POA: Insufficient documentation

## 2013-06-23 DIAGNOSIS — IMO0001 Reserved for inherently not codable concepts without codable children: Secondary | ICD-10-CM | POA: Insufficient documentation

## 2013-06-23 DIAGNOSIS — R059 Cough, unspecified: Secondary | ICD-10-CM | POA: Insufficient documentation

## 2013-06-23 DIAGNOSIS — Z3202 Encounter for pregnancy test, result negative: Secondary | ICD-10-CM | POA: Insufficient documentation

## 2013-06-23 DIAGNOSIS — R05 Cough: Secondary | ICD-10-CM | POA: Insufficient documentation

## 2013-06-23 DIAGNOSIS — B349 Viral infection, unspecified: Secondary | ICD-10-CM

## 2013-06-23 DIAGNOSIS — J309 Allergic rhinitis, unspecified: Secondary | ICD-10-CM | POA: Insufficient documentation

## 2013-06-23 DIAGNOSIS — R112 Nausea with vomiting, unspecified: Secondary | ICD-10-CM | POA: Insufficient documentation

## 2013-06-23 DIAGNOSIS — J3489 Other specified disorders of nose and nasal sinuses: Secondary | ICD-10-CM | POA: Insufficient documentation

## 2013-06-23 DIAGNOSIS — Z79899 Other long term (current) drug therapy: Secondary | ICD-10-CM | POA: Insufficient documentation

## 2013-06-23 DIAGNOSIS — Z87891 Personal history of nicotine dependence: Secondary | ICD-10-CM | POA: Insufficient documentation

## 2013-06-23 DIAGNOSIS — R197 Diarrhea, unspecified: Secondary | ICD-10-CM | POA: Insufficient documentation

## 2013-06-23 DIAGNOSIS — Z8742 Personal history of other diseases of the female genital tract: Secondary | ICD-10-CM | POA: Insufficient documentation

## 2013-06-23 LAB — CBC WITH DIFFERENTIAL/PLATELET
Basophils Relative: 0 % (ref 0–1)
Eosinophils Relative: 1 % (ref 0–5)
Hemoglobin: 12.8 g/dL (ref 12.0–15.0)
Lymphocytes Relative: 8 % — ABNORMAL LOW (ref 12–46)
Lymphs Abs: 0.9 10*3/uL (ref 0.7–4.0)
MCHC: 33.9 g/dL (ref 30.0–36.0)
MCV: 90.2 fL (ref 78.0–100.0)
Monocytes Absolute: 0.6 10*3/uL (ref 0.1–1.0)
Monocytes Relative: 5 % (ref 3–12)
Neutro Abs: 10.1 10*3/uL — ABNORMAL HIGH (ref 1.7–7.7)
Neutrophils Relative %: 87 % — ABNORMAL HIGH (ref 43–77)
RBC: 4.19 MIL/uL (ref 3.87–5.11)
WBC: 11.6 10*3/uL — ABNORMAL HIGH (ref 4.0–10.5)

## 2013-06-23 LAB — COMPREHENSIVE METABOLIC PANEL
Albumin: 4.3 g/dL (ref 3.5–5.2)
Alkaline Phosphatase: 54 U/L (ref 39–117)
BUN: 10 mg/dL (ref 6–23)
CO2: 25 mEq/L (ref 19–32)
Chloride: 105 mEq/L (ref 96–112)
Potassium: 3.8 mEq/L (ref 3.5–5.1)
Total Bilirubin: 0.7 mg/dL (ref 0.3–1.2)

## 2013-06-23 LAB — URINALYSIS, ROUTINE W REFLEX MICROSCOPIC
Hgb urine dipstick: NEGATIVE
Leukocytes, UA: NEGATIVE
Nitrite: NEGATIVE
Protein, ur: NEGATIVE mg/dL
Specific Gravity, Urine: 1.029 (ref 1.005–1.030)
pH: 7 (ref 5.0–8.0)

## 2013-06-23 LAB — POCT PREGNANCY, URINE: Preg Test, Ur: NEGATIVE

## 2013-06-23 MED ORDER — ONDANSETRON 8 MG PO TBDP: 8.0000 mg | ORAL_TABLET | Freq: Three times a day (TID) | ORAL | Status: DC | PRN

## 2013-06-23 MED ORDER — ONDANSETRON 4 MG PO TBDP
8.0000 mg | ORAL_TABLET | Freq: Once | ORAL | Status: AC
  Administered 2013-06-23: 8 mg via ORAL
  Filled 2013-06-23: qty 2

## 2013-06-23 MED ORDER — ACETAMINOPHEN 325 MG PO TABS
650.0000 mg | ORAL_TABLET | Freq: Once | ORAL | Status: AC
  Administered 2013-06-23: 650 mg via ORAL
  Filled 2013-06-23: qty 2

## 2013-06-23 NOTE — ED Notes (Signed)
Family at bedside. 

## 2013-06-23 NOTE — ED Notes (Signed)
Pt c/o lwr rt sided abd pain w/ diarrhea since 11am 7/09. States pain radiates to her bck on the lwr rt side. Pt reports emesis X 10 in 24 hrs and diarrhea X 4.

## 2013-06-23 NOTE — ED Notes (Signed)
PT. REPORTS EMESIS WITH DIARRHEA ONSET YESTERDAY  ,,DENIES FEVER OR CHILLS.

## 2013-06-23 NOTE — ED Notes (Signed)
Patient is resting comfortably. 

## 2013-06-23 NOTE — ED Provider Notes (Signed)
History    CSN: [id number] Arrival date & time 06/23/13  0051  First MD Initiated Contact with Patient 06/23/13 0206     Chief Complaint  Patient presents with  . Emesis  . Diarrhea   (Consider location/radiation/quality/duration/timing/severity/associated sxs/prior Treatment) HPI 22 yo female presents to the ER from home with complaint of body aches, chills, n/v/d.  Sxs started yesterday around 11 am.  She denies any sick contacts, no travel, no unusual foods.  She has had runny nose, cough, congestion as well.  No tx prior to arrival.  3 episodes of diarrhea, multiple episodes of vomiting.  No focal abd pain.  No urinary symptoms.  LMP 6/23.  No vaginal d/c, no change in sexual partners, no back pain, no rashes.  Past Medical History  Diagnosis Date  . HPV in female   . Syphilis 2006  . Abnormal Pap smear 01/2009  . BV (bacterial vaginosis) 2010  . H/O cervicitis 2010  . Pelvic pain in female 2010  . Dysplasia 2010  . History of chlamydia 06/05/09  . Itching 09/04/2009  . Weight loss   . H/O gonorrhea 2010  . Irregular periods/menstrual cycles   . H/O varicella    History reviewed. No pertinent past surgical history. Family History  Problem Relation Age of Onset  . Drug abuse Father   . COPD Maternal Grandmother     Emphysema   History  Substance Use Topics  . Smoking status: Former Games developer  . Smokeless tobacco: Not on file  . Alcohol Use: No   OB History   Grav Para Term Preterm Abortions TAB SAB Ect Mult Living   1 1 1       1      Review of Systems  All other systems reviewed and are negative.    Allergies  Review of patient's allergies indicates no known allergies.  Home Medications   Current Outpatient Rx  Name  Route  Sig  Dispense  Refill  . levonorgestrel (MIRENA) 20 MCG/24HR IUD   Intrauterine   1 each by Intrauterine route once.          BP 116/78  Pulse 113  Temp(Src) 98.8 F (37.1 C) (Oral)  Resp 18  SpO2 99%  LMP  06/04/2013 Physical Exam  Nursing note and vitals reviewed. Constitutional: She is oriented to person, place, and time. She appears well-developed and well-nourished.  HENT:  Head: Normocephalic and atraumatic.  Nose: Nose normal.  Dry mucous membranes  Eyes: Conjunctivae and EOM are normal. Pupils are equal, round, and reactive to light.  Neck: Normal range of motion. Neck supple. No JVD present. No tracheal deviation present. No thyromegaly present.  Cardiovascular: Normal rate, regular rhythm, normal heart sounds and intact distal pulses.  Exam reveals no gallop and no friction rub.   No murmur heard. Pulmonary/Chest: Effort normal and breath sounds normal. No stridor. No respiratory distress. She has no wheezes. She has no rales. She exhibits no tenderness.  Abdominal: Soft. She exhibits no distension and no mass. There is no tenderness. There is no rebound and no guarding.  Hyperactive bowel sounds.  No abd pain, soft nondistended  Musculoskeletal: Normal range of motion. She exhibits no edema and no tenderness.  Lymphadenopathy:    She has no cervical adenopathy.  Neurological: She is alert and oriented to person, place, and time. She exhibits normal muscle tone. Coordination normal.  Skin: Skin is warm and dry. No rash noted. No erythema. No pallor.  Psychiatric: She has  a normal mood and affect. Her behavior is normal. Judgment and thought content normal.    ED Course  Procedures (including critical care time) Labs Reviewed  URINALYSIS, ROUTINE W REFLEX MICROSCOPIC - Abnormal; Notable for the following:    APPearance CLOUDY (*)    Ketones, ur 15 (*)    All other components within normal limits  CBC WITH DIFFERENTIAL - Abnormal; Notable for the following:    WBC 11.6 (*)    Neutrophils Relative % 87 (*)    Neutro Abs 10.1 (*)    Lymphocytes Relative 8 (*)    All other components within normal limits  COMPREHENSIVE METABOLIC PANEL - Abnormal; Notable for the following:     Glucose, Bld 107 (*)    All other components within normal limits  POCT PREGNANCY, URINE   Dg Chest 2 View  06/23/2013   *RADIOLOGY REPORT*  Clinical Data: Cough since yesterday.  Chest pain.  CHEST - 2 VIEW  Comparison: 01/03/2013  Findings: The heart size and pulmonary vascularity are normal. The lungs appear clear and expanded without focal air space disease or consolidation. No blunting of the costophrenic angles.  No pneumothorax.  Mediastinal contours appear intact.  No significant change since previous study.  IMPRESSION: No evidence of active pulmonary disease.   Original Report Authenticated By: Burman Nieves, M.D.   1. Nausea vomiting and diarrhea   2. Viral syndrome     MDM  22 yo female with low grade fever, n/v/d and URI sxs.  Most likely viral syndrome.  Has tolerated fluids after ODT zofran.  Will plan for d/c with rx for zofran, tylenol/motrin for fevers, body aches.  Olivia Mackie, MD 06/23/13 [phone]

## 2013-09-12 ENCOUNTER — Inpatient Hospital Stay (HOSPITAL_COMMUNITY)
Admission: AD | Admit: 2013-09-12 | Discharge: 2013-09-12 | Disposition: A | Discharge status: Home / Self Care | Payer: Managed Care, Other (non HMO) | Source: Ambulatory Visit | Attending: Obstetrics and Gynecology | Admitting: Obstetrics and Gynecology

## 2013-09-12 ENCOUNTER — Encounter (HOSPITAL_COMMUNITY): Payer: Self-pay

## 2013-09-12 ENCOUNTER — Other Ambulatory Visit: Payer: Self-pay | Admitting: Family Medicine

## 2013-09-12 DIAGNOSIS — O210 Mild hyperemesis gravidarum: Secondary | ICD-10-CM | POA: Insufficient documentation

## 2013-09-12 DIAGNOSIS — A6000 Herpesviral infection of urogenital system, unspecified: Secondary | ICD-10-CM | POA: Diagnosis not present

## 2013-09-12 DIAGNOSIS — O98219 Gonorrhea complicating pregnancy, unspecified trimester: Secondary | ICD-10-CM | POA: Diagnosis not present

## 2013-09-12 DIAGNOSIS — F17200 Nicotine dependence, unspecified, uncomplicated: Secondary | ICD-10-CM | POA: Diagnosis present

## 2013-09-12 DIAGNOSIS — Z8619 Personal history of other infectious and parasitic diseases: Secondary | ICD-10-CM | POA: Diagnosis not present

## 2013-09-12 MED ORDER — LACTATED RINGERS IV BOLUS (SEPSIS)
500.0000 mL | Freq: Once | INTRAVENOUS | Status: AC
  Administered 2013-09-12: 18:00:00 via INTRAVENOUS

## 2013-09-12 MED ORDER — ONDANSETRON HCL 4 MG/2ML IJ SOLN
4.0000 mg | Freq: Once | INTRAMUSCULAR | Status: AC
  Administered 2013-09-12: 4 mg via INTRAVENOUS
  Filled 2013-09-12: qty 2

## 2013-09-12 MED ORDER — PROMETHAZINE HCL 25 MG/ML IJ SOLN
25.0000 mg | Freq: Once | INTRAMUSCULAR | Status: AC
  Administered 2013-09-12: 25 mg via INTRAVENOUS
  Filled 2013-09-12: qty 1

## 2013-09-12 NOTE — MAU Note (Signed)
Patient states she has been having nausea and vomiting for about 2 weeks. States she was seen in the office and had ketones and told to come to MAU for IVF's. Denies bleeding but does have a vaginal discharge.

## 2013-09-12 NOTE — MAU Provider Note (Signed)
History   22 yo G2P1001 at 8 weeks presented from office evaluation for IV fluids and antiemetic treatment due to persistent N/V.   Had been Rx'd Zofran, but that caused significant constipation; then Diclegis, but has not been able to keep it down.  Had [place] for viability at 6 1/7 weeks.  Denies fever, dysuria, cramping, d/c, bleeding, or any other sx.  Denies reflux.  At office visit, Phenergan was Rx'd and correct use of Diclegis was reviewed.  Patient Active Problem List   Diagnosis Date Noted  . Gonorrhea complicating pregnancy 09/12/2013  . Smoker 09/12/2013  . Hyperemesis arising during pregnancy 09/12/2013  . Hx of syphilis 09/12/2013  . 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 Hx of chlamydia infection 09/12/2013  HSV, syphilis, chlamydia remote from pregnancy.  + GC this pregnancy, with negative TOC in September.   Chief Complaint  Patient presents with  . Emesis     OB History   Grav Para Term Preterm Abortions TAB SAB Ect Mult Living   2 1 1       1       Past Medical History  Diagnosis Date  . HPV in female   . Syphilis 2006  . Abnormal Pap smear 01/2009  . BV (bacterial vaginosis) 2010  . H/O cervicitis 2010  . Pelvic pain in female 2010  . Dysplasia 2010  . History of chlamydia 06/05/09  . Itching 09/04/2009  . Weight loss   . H/O gonorrhea 2010  . Irregular periods/menstrual cycles   . H/O varicella     History reviewed. No pertinent past surgical history.  Family History  Problem Relation Age of Onset  . Drug abuse Father   . COPD Maternal Grandmother     Emphysema    History  Substance Use Topics  . Smoking status: Former Games developer  . Smokeless tobacco: Not on file  . Alcohol Use: No    Allergies: No Known Allergies  Prescriptions prior to admission  Medication Sig Dispense Refill  . DICLEGIS 10-10 MG TBEC Take 1-2 tablets by mouth 2 (two) times daily as needed (for nausea/vomiting).       . promethazine (PHENERGAN) 12.5 MG tablet Take 12.5 mg by mouth  every 4 (four) hours as needed for nausea.          Physical Exam   Blood pressure 108/71, pulse 74, temperature 98.2 F (36.8 C), temperature source Oral, resp. rate 16, height 5' 4.5" (1.638 m), weight 161 lb (73.029 kg), last menstrual period 07/18/2013, SpO2 100.00%.  Currently feeling "some better" after approx 700 cc fluids and Phenergan 25 mg IV dose at 6pm. Chest clear Heart RRR without murmur Abd soft, NT Pelvic--deferred Ext WNL  Moderate ketones on office urine.  ED Course  IUP at 8 weeks Hyperemesis  Plan: IV hydration, Phenergan IV Recheck ketones after hydration Offer po fluids as nausea abates.   Nigel Bridgeman CNM, [place] 09/12/2013 7:41 PM  Addendum: Has been sleeping soundly for 2 hours--took sips of ginger ale without vomiting, no vomiting during stay in MAU. Voided once, but not collected. D/C'd home with hyperemesis precautions. Continue Diclegis and Phenergan po prn. Keep scheduled appt at [facility], and is to call prn.  Nigel Bridgeman, CNM 09/12/13 10:20p

## 2013-09-14 LAB — OB RESULTS CONSOLE ANTIBODY SCREEN: Antibody Screen: NEGATIVE

## 2013-09-14 LAB — OB RESULTS CONSOLE ABO/RH: RH TYPE: POSITIVE

## 2013-09-14 LAB — OB RESULTS CONSOLE HEPATITIS B SURFACE ANTIGEN: Hepatitis B Surface Ag: NEGATIVE

## 2013-09-14 LAB — OB RESULTS CONSOLE HIV ANTIBODY (ROUTINE TESTING): HIV: NONREACTIVE

## 2013-09-18 ENCOUNTER — Inpatient Hospital Stay (HOSPITAL_COMMUNITY)
Admission: AD | Admit: 2013-09-18 | Discharge: 2013-09-18 | Disposition: A | Discharge status: Home / Self Care | Payer: Managed Care, Other (non HMO) | Source: Ambulatory Visit | Attending: Obstetrics and Gynecology | Admitting: Obstetrics and Gynecology

## 2013-09-18 ENCOUNTER — Encounter (HOSPITAL_COMMUNITY): Payer: Self-pay | Admitting: *Deleted

## 2013-09-18 DIAGNOSIS — O26859 Spotting complicating pregnancy, unspecified trimester: Secondary | ICD-10-CM | POA: Insufficient documentation

## 2013-09-18 LAB — URINALYSIS, ROUTINE W REFLEX MICROSCOPIC
Ketones, ur: NEGATIVE mg/dL
Leukocytes, UA: NEGATIVE
Nitrite: NEGATIVE
Protein, ur: NEGATIVE mg/dL
Urobilinogen, UA: 0.2 mg/dL (ref 0.0–1.0)

## 2013-09-18 LAB — WET PREP, GENITAL
Clue Cells Wet Prep HPF POC: NONE SEEN
Trich, Wet Prep: NONE SEEN

## 2013-09-18 LAB — URINE MICROSCOPIC-ADD ON

## 2013-09-18 NOTE — MAU Provider Note (Signed)
History   22 yo G2P1001 at [redacted]w[redacted]d presents unannounced for spotting after IC last night around 0200.  Denies spotting since  Blood type B pos.  Denies LOF, UCs, recent fever, resp or GI c/o's, UTI s/s.  Chief Complaint  Patient presents with  . Vaginal Bleeding    OB History   Grav Para Term Preterm Abortions TAB SAB Ect Mult Living   2 1 1       1       Past Medical History  Diagnosis Date  . HPV in female   . Syphilis 2006  . Abnormal Pap smear 01/2009  . BV (bacterial vaginosis) 2010  . H/O cervicitis 2010  . Pelvic pain in female 2010  . Dysplasia 2010  . History of chlamydia 06/05/09  . Itching 09/04/2009  . Weight loss   . H/O gonorrhea 2010  . Irregular periods/menstrual cycles   . H/O varicella     No past surgical history on file.  Family History  Problem Relation Age of Onset  . Drug abuse Father   . COPD Maternal Grandmother     Emphysema    History  Substance Use Topics  . Smoking status: Former Games developer  . Smokeless tobacco: Not on file  . Alcohol Use: No    Allergies: No Known Allergies  Prescriptions prior to admission  Medication Sig Dispense Refill  . DICLEGIS 10-10 MG TBEC Take 1-2 tablets by mouth 2 (two) times daily as needed (for nausea/vomiting).       . promethazine (PHENERGAN) 12.5 MG tablet Take 12.5 mg by mouth every 4 (four) hours as needed for nausea.         ROS: see HPI above, all other systems are negative   Physical Exam   Blood pressure 119/62, pulse 75, temperature 98 F (36.7 C), resp. rate 16, height 5\' 5"  (1.651 m), weight 161 lb (73.029 kg), last menstrual period 07/18/2013.  Chest: Clear Heart: RRR Abdomen: gravid, NT Extremities: WNL  Pelvic exam: normal external genitalia, vulva, vagina, cervix, uterus and adnexa.  Sm amount of brown discharge noted in vaginal vault  Results for orders placed during the hospital encounter of 09/18/13 (from the past 24 hour(s))  URINALYSIS, ROUTINE W REFLEX MICROSCOPIC     Status:  Abnormal   Collection Time    09/18/13 12:00 PM      Result Value Range   Color, Urine YELLOW  YELLOW   APPearance CLEAR  CLEAR   Specific Gravity, Urine >1.030 (*) 1.005 - 1.030   pH 6.0  5.0 - 8.0   Glucose, UA NEGATIVE  NEGATIVE mg/dL   Hgb urine dipstick MODERATE (*) NEGATIVE   Bilirubin Urine NEGATIVE  NEGATIVE   Ketones, ur NEGATIVE  NEGATIVE mg/dL   Protein, ur NEGATIVE  NEGATIVE mg/dL   Urobilinogen, UA 0.2  0.0 - 1.0 mg/dL   Nitrite NEGATIVE  NEGATIVE   Leukocytes, UA NEGATIVE  NEGATIVE  URINE MICROSCOPIC-ADD ON     Status: Abnormal   Collection Time    09/18/13 12:00 PM      Result Value Range   Squamous Epithelial / LPF FEW (*) RARE   WBC, UA 0-2  <3 WBC/hpf   RBC / HPF 0-2  <3 RBC/hpf   Bacteria, UA FEW (*) RARE   Urine-Other MUCOUS PRESENT      ED Course  IUP at [redacted]w[redacted]d Spotting with IC B pos Wet prep pending  D/c home with bleeding precautions, pelvic rest, and s/s to call for  F/u tomorrow for already scheduled NOB w/u     Haroldine Laws CNM, MSN 09/18/2013 12:04 PM

## 2013-09-18 NOTE — MAU Note (Signed)
Pt presents with complaints of bright red vaginal bleeding after intercourse last night. Denies any craming

## 2013-09-18 NOTE — MAU Note (Signed)
Patient presents with bright red, mucusy discharge after intercourse last night.

## 2013-09-29 ENCOUNTER — Encounter (HOSPITAL_COMMUNITY): Payer: Self-pay | Admitting: *Deleted

## 2013-09-29 ENCOUNTER — Other Ambulatory Visit (HOSPITAL_COMMUNITY): Payer: Self-pay | Admitting: Obstetrics and Gynecology

## 2013-09-29 ENCOUNTER — Inpatient Hospital Stay (HOSPITAL_COMMUNITY): Payer: Managed Care, Other (non HMO)

## 2013-09-29 ENCOUNTER — Inpatient Hospital Stay (HOSPITAL_COMMUNITY)
Admission: AD | Admit: 2013-09-29 | Discharge: 2013-09-29 | Disposition: A | Discharge status: Home / Self Care | Payer: Managed Care, Other (non HMO) | Source: Ambulatory Visit | Attending: Obstetrics and Gynecology | Admitting: Obstetrics and Gynecology

## 2013-09-29 DIAGNOSIS — O210 Mild hyperemesis gravidarum: Secondary | ICD-10-CM | POA: Insufficient documentation

## 2013-09-29 MED ORDER — PANTOPRAZOLE SODIUM 40 MG IV SOLR
40.0000 mg | INTRAVENOUS | Status: DC
  Administered 2013-09-29: 40 mg via INTRAVENOUS
  Filled 2013-09-29: qty 40

## 2013-09-29 MED ORDER — PROMETHAZINE HCL 25 MG/ML IJ SOLN
12.5000 mg | Freq: Four times a day (QID) | INTRAMUSCULAR | Status: DC | PRN
  Administered 2013-09-29: 12.5 mg via INTRAVENOUS
  Filled 2013-09-29: qty 1

## 2013-09-29 MED ORDER — LACTATED RINGERS IV BOLUS (SEPSIS)
500.0000 mL | Freq: Once | INTRAVENOUS | Status: AC
  Administered 2013-09-29: 1000 mL via INTRAVENOUS

## 2013-09-29 MED ORDER — LACTATED RINGERS IV SOLN
INTRAVENOUS | Status: DC
  Administered 2013-09-29: 17:00:00 via INTRAVENOUS

## 2013-09-29 NOTE — MAU Note (Signed)
Ongoing problem with vomiting.  Sent from office for fluids, meds and [place]

## 2013-09-29 NOTE — MAU Provider Note (Signed)
  History     CSN: [id number]  Arrival date and time: 09/29/13 1401   None  Pt sent from office today for IVF and hyperemetic protocol because of N/V.  A viability u/s was also ordered because fetal heart tones were unable to be obtained in the office.  Pt denies any other symptoms and has f/u in the office next Fri.  Pt denies bleeding or cramping.  Chief Complaint  Patient presents with  . Emesis   HPI  OB History   Grav Para Term Preterm Abortions TAB SAB Ect Mult Living   2 1 1       1       Past Medical History  Diagnosis Date  . HPV in female   . Syphilis 2006  . Abnormal Pap smear 01/2009  . BV (bacterial vaginosis) 2010  . H/O cervicitis 2010  . Pelvic pain in female 2010  . Dysplasia 2010  . History of chlamydia 06/05/09  . Itching 09/04/2009  . Weight loss   . H/O gonorrhea 2010  . Irregular periods/menstrual cycles   . H/O varicella     History reviewed. No pertinent past surgical history.  Family History  Problem Relation Age of Onset  . Drug abuse Father   . COPD Maternal Grandmother     Emphysema    History  Substance Use Topics  . Smoking status: Former Games developer  . Smokeless tobacco: Not on file  . Alcohol Use: No    Allergies: No Known Allergies  Prescriptions prior to admission  Medication Sig Dispense Refill  . ondansetron (ZOFRAN) 4 MG tablet Take 4 mg by mouth every 8 (eight) hours as needed for nausea.        ROS Physical Exam   Blood pressure 121/59, pulse 85, temperature 98.1 F (36.7 C), temperature source Oral, resp. rate 16, weight 73.392 kg (161 lb 12.8 oz), last menstrual period 07/18/2013.  Physical Exam  Lungs CTA CV RRR Abd soft, NT Ext no calf tenderness  MAU Course  Procedures  U/S + FHTs at 174 with small subchorionic hemorrhage  Assessment and Plan  P1 at 10 3/7wks with hyperemesis undergoing protocol.  Once pt has received adequate hydration and antiemetics, will d/c home to f/u in office as  scheduled.  Purcell Nails 09/29/2013, 4:49 PM

## 2013-09-29 NOTE — MAU Note (Signed)
Pt states she has been having  Nausea since the beginning of her pregnancy, Pt states it has gotten better

## 2013-12-09 ENCOUNTER — Emergency Department (INDEPENDENT_AMBULATORY_CARE_PROVIDER_SITE_OTHER)
Admission: EM | Admit: 2013-12-09 | Discharge: 2013-12-09 | Disposition: A | Discharge status: Home / Self Care | Payer: Managed Care, Other (non HMO) | Source: Home / Self Care | Attending: Family Medicine | Admitting: Family Medicine

## 2013-12-09 ENCOUNTER — Encounter (HOSPITAL_COMMUNITY): Payer: Self-pay | Admitting: Emergency Medicine

## 2013-12-09 DIAGNOSIS — J069 Acute upper respiratory infection, unspecified: Secondary | ICD-10-CM

## 2013-12-09 MED ORDER — DEXTROMETHORPHAN POLISTIREX 30 MG/5ML PO LQCR: 60.0000 mg | Freq: Two times a day (BID) | ORAL | Status: DC

## 2013-12-09 MED ORDER — IPRATROPIUM BROMIDE 0.06 % NA SOLN: 2.0000 | Freq: Four times a day (QID) | NASAL | Status: DC

## 2013-12-09 NOTE — ED Notes (Signed)
Hoarse, cough, sneezing, sore throat-5 MONTHS PREGNANT/EDC 5/11

## 2013-12-09 NOTE — ED Provider Notes (Signed)
CSN: [id number]     Arrival date & time 12/09/13  0831 History   First MD Initiated Contact with Patient 12/09/13 [phone]     Chief Complaint  Patient presents with  . URI   (Consider location/radiation/quality/duration/timing/severity/associated sxs/prior Treatment) Patient is a 22 y.o. female presenting with URI. The history is provided by the patient.  URI Presenting symptoms: congestion, cough, rhinorrhea and sore throat   Presenting symptoms: no fever   Severity:  Mild Onset quality:  Gradual Duration:  2 days Timing:  Constant Progression:  Unchanged Chronicity:  New Associated symptoms: sneezing   Associated symptoms: no myalgias   Risk factors comment:  38mo preg.   Past Medical History  Diagnosis Date  . HPV in female   . Syphilis 2006  . Abnormal Pap smear 01/2009  . BV (bacterial vaginosis) 2010  . H/O cervicitis 2010  . Pelvic pain in female 2010  . Dysplasia 2010  . History of chlamydia 06/05/09  . Itching 09/04/2009  . Weight loss   . H/O gonorrhea 2010  . Irregular periods/menstrual cycles   . H/O varicella    History reviewed. No pertinent past surgical history. Family History  Problem Relation Age of Onset  . Drug abuse Father   . COPD Maternal Grandmother     Emphysema   History  Substance Use Topics  . Smoking status: Former Games developer  . Smokeless tobacco: Not on file  . Alcohol Use: No   OB History   Grav Para Term Preterm Abortions TAB SAB Ect Mult Living   2 1 1       1      Review of Systems  Constitutional: Negative.  Negative for fever.  HENT: Positive for congestion, postnasal drip, rhinorrhea, sneezing and sore throat.   Respiratory: Positive for cough.   Cardiovascular: Negative.   Gastrointestinal: Negative.   Musculoskeletal: Negative for myalgias.    Allergies  Review of patient's allergies indicates no known allergies.  Home Medications   Current Outpatient Rx  Name  Route  Sig  Dispense  Refill  . dextromethorphan  (DELSYM) 30 MG/5ML liquid   Oral   Take 10 mLs (60 mg total) by mouth 2 (two) times daily. Prn cough   89 mL   0   . ipratropium (ATROVENT) 0.06 % nasal spray   Nasal   Place 2 sprays into the nose 4 (four) times daily.   15 mL   1   . ondansetron (ZOFRAN) 4 MG tablet   Oral   Take 4 mg by mouth every 8 (eight) hours as needed for nausea.          BP 105/73  Pulse 92  Temp(Src) 98.2 F (36.8 C) (Oral)  Resp 22  SpO2 98%  LMP 07/18/2013 Physical Exam  Nursing note and vitals reviewed. Constitutional: She is oriented to person, place, and time. She appears well-developed and well-nourished.  HENT:  Head: Normocephalic.  Right Ear: External ear normal.  Left Ear: External ear normal.  Nose: Mucosal edema and rhinorrhea present.  Mouth/Throat: Oropharynx is clear and moist.  Neck: Normal range of motion. Neck supple.  Cardiovascular: Regular rhythm.   Pulmonary/Chest: Breath sounds normal.  Lymphadenopathy:    She has no cervical adenopathy.  Neurological: She is alert and oriented to person, place, and time.  Skin: Skin is warm and dry.    ED Course  Procedures (including critical care time) Labs Review Labs Reviewed - No data to display Imaging Review No results found.  EKG Interpretation    Date/Time:    Ventricular Rate:    PR Interval:    QRS Duration:   QT Interval:    QTC Calculation:   R Axis:     Text Interpretation:              MDM      Linna Hoff, MD 12/09/13 [phone]

## 2013-12-15 NOTE — L&D Delivery Note (Signed)
0340: Patient reporting rectal pressure with contractions.  Fetal tracing with prolonged deceleration x 3 minutes.  Pitocin turned off prior to provider arrival.  SVE: 10/100/0.  Patient encouraged to start pushing.  Pitocin turned back on at 2310mUn/min to regulate contractions.  Patient delivered as below.   Delivery Note At 4:34 AM a viable female "Dawn Barnett" was delivered via SVD (Presentation: Left Occiput Anterior) with a double nuchal cord that was reduced without incident.  Shoulders delivered with ease and infant with APGARs: 7, 8; weight.  Placenta delivered spontaneously, inspected and appeared to be intact.  Cord: 3 vessels with the following complications: None  Cord pH: N/A Patient with firm fundus, but constant trickle of blood.  Uterine exploration yielded some small clots. Patient given 1000mg  of cytotec per rectum. Bleeding small to moderate upon provider exit.  Patient okay for nubain for pain.   Anesthesia: Epidural  Episiotomy: None Lacerations: None Suture Repair: N/A Est. Blood Loss (mL): 300  Mom to postpartum.  Baby to Couplet care / Skin to Skin. Bottle feeding  Gerrit HeckJessica Taiana Temkin 04/21/2014, 5:22 AM

## 2014-01-30 LAB — OB RESULTS CONSOLE RPR: RPR: NONREACTIVE

## 2014-03-07 ENCOUNTER — Other Ambulatory Visit: Payer: Self-pay | Admitting: Obstetrics and Gynecology

## 2014-03-12 ENCOUNTER — Inpatient Hospital Stay (HOSPITAL_COMMUNITY)
Admission: AD | Admit: 2014-03-12 | Discharge: 2014-03-12 | Disposition: A | Discharge status: Home / Self Care | Payer: Managed Care, Other (non HMO) | Source: Ambulatory Visit | Attending: Obstetrics and Gynecology | Admitting: Obstetrics and Gynecology

## 2014-03-12 ENCOUNTER — Encounter (HOSPITAL_COMMUNITY): Payer: Self-pay | Admitting: *Deleted

## 2014-03-12 DIAGNOSIS — O9989 Other specified diseases and conditions complicating pregnancy, childbirth and the puerperium: Principal | ICD-10-CM

## 2014-03-12 DIAGNOSIS — R109 Unspecified abdominal pain: Secondary | ICD-10-CM | POA: Insufficient documentation

## 2014-03-12 DIAGNOSIS — N898 Other specified noninflammatory disorders of vagina: Secondary | ICD-10-CM | POA: Insufficient documentation

## 2014-03-12 DIAGNOSIS — O99891 Other specified diseases and conditions complicating pregnancy: Secondary | ICD-10-CM | POA: Insufficient documentation

## 2014-03-12 LAB — URINALYSIS, ROUTINE W REFLEX MICROSCOPIC
BILIRUBIN URINE: NEGATIVE
GLUCOSE, UA: NEGATIVE mg/dL
Hgb urine dipstick: NEGATIVE
KETONES UR: NEGATIVE mg/dL
LEUKOCYTES UA: NEGATIVE
NITRITE: NEGATIVE
PH: 7.5 (ref 5.0–8.0)
Protein, ur: NEGATIVE mg/dL
SPECIFIC GRAVITY, URINE: 1.015 (ref 1.005–1.030)
Urobilinogen, UA: 0.2 mg/dL (ref 0.0–1.0)

## 2014-03-12 LAB — URINE MICROSCOPIC-ADD ON

## 2014-03-12 LAB — WET PREP, GENITAL
Trich, Wet Prep: NONE SEEN
YEAST WET PREP: NONE SEEN

## 2014-03-12 MED ORDER — METRONIDAZOLE 0.75 % VA GEL: 1.0000 | Freq: Every day | VAGINAL | Status: DC

## 2014-03-12 NOTE — Discharge Instructions (Signed)
Fetal Movement Counts °Patient Name: __________________________________________________ Patient Due Date: ____________________ °Performing a fetal movement count is highly recommended in high-risk pregnancies, but it is good for every pregnant woman to do. Your caregiver may ask you to start counting fetal movements at 28 weeks of the pregnancy. Fetal movements often increase: °· After eating a full meal. °· After physical activity. °· After eating or drinking something sweet or cold. °· At rest. °Pay attention to when you feel the baby is most active. This will help you notice a pattern of your baby's sleep and wake cycles and what factors contribute to an increase in fetal movement. It is important to perform a fetal movement count at the same time each day when your baby is normally most active.  °HOW TO COUNT FETAL MOVEMENTS °1. Find a quiet and comfortable area to sit or lie down on your left side. Lying on your left side provides the best blood and oxygen circulation to your baby. °2. Write down the day and time on a sheet of paper or in a journal. °3. Start counting kicks, flutters, swishes, rolls, or jabs in a 2 hour period. You should feel at least 10 movements within 2 hours. °4. If you do not feel 10 movements in 2 hours, wait 2 3 hours and count again. Look for a change in the pattern or not enough counts in 2 hours. °SEEK MEDICAL CARE IF: °· You feel less than 10 counts in 2 hours, tried twice. °· There is no movement in over an hour. °· The pattern is changing or taking longer each day to reach 10 counts in 2 hours. °· You feel the baby is not moving as he or she usually does. °Date: ____________ Movements: ____________ Start time: ____________ Finish time: ____________  °Date: ____________ Movements: ____________ Start time: ____________ Finish time: ____________ °Date: ____________ Movements: ____________ Start time: ____________ Finish time: ____________ °Date: ____________ Movements: ____________  Start time: ____________ Finish time: ____________ °Date: ____________ Movements: ____________ Start time: ____________ Finish time: ____________ °Date: ____________ Movements: ____________ Start time: ____________ Finish time: ____________ °Date: ____________ Movements: ____________ Start time: ____________ Finish time: ____________ °Date: ____________ Movements: ____________ Start time: ____________ Finish time: ____________  °Date: ____________ Movements: ____________ Start time: ____________ Finish time: ____________ °Date: ____________ Movements: ____________ Start time: ____________ Finish time: ____________ °Date: ____________ Movements: ____________ Start time: ____________ Finish time: ____________ °Date: ____________ Movements: ____________ Start time: ____________ Finish time: ____________ °Date: ____________ Movements: ____________ Start time: ____________ Finish time: ____________ °Date: ____________ Movements: ____________ Start time: ____________ Finish time: ____________ °Date: ____________ Movements: ____________ Start time: ____________ Finish time: ____________  °Date: ____________ Movements: ____________ Start time: ____________ Finish time: ____________ °Date: ____________ Movements: ____________ Start time: ____________ Finish time: ____________ °Date: ____________ Movements: ____________ Start time: ____________ Finish time: ____________ °Date: ____________ Movements: ____________ Start time: ____________ Finish time: ____________ °Date: ____________ Movements: ____________ Start time: ____________ Finish time: ____________ °Date: ____________ Movements: ____________ Start time: ____________ Finish time: ____________ °Date: ____________ Movements: ____________ Start time: ____________ Finish time: ____________  °Date: ____________ Movements: ____________ Start time: ____________ Finish time: ____________ °Date: ____________ Movements: ____________ Start time: ____________ Finish time:  ____________ °Date: ____________ Movements: ____________ Start time: ____________ Finish time: ____________ °Date: ____________ Movements: ____________ Start time: ____________ Finish time: ____________ °Date: ____________ Movements: ____________ Start time: ____________ Finish time: ____________ °Date: ____________ Movements: ____________ Start time: ____________ Finish time: ____________ °Date: ____________ Movements: ____________ Start time: ____________ Finish time: ____________  °Date: ____________ Movements: ____________ Start time: ____________ Finish   time: ____________ Date: ____________ Movements: ____________ Start time: ____________ Dawn MartinFinish time: ____________ Date: ____________ Movements: ____________ Start time: ____________ Dawn MartinFinish time: ____________ Date: ____________ Movements: ____________ Start time: ____________ Dawn MartinFinish time: ____________ Date: ____________ Movements: ____________ Start time: ____________ Dawn MartinFinish time: ____________ Date: ____________ Movements: ____________ Start time: ____________ Dawn MartinFinish time: ____________ Date: ____________ Movements: ____________ Start time: ____________ Dawn MartinFinish time: ____________  Date: ____________ Movements: ____________ Start time: ____________ Dawn MartinFinish time: ____________ Date: ____________ Movements: ____________ Start time: ____________ Dawn MartinFinish time: ____________ Date: ____________ Movements: ____________ Start time: ____________ Dawn MartinFinish time: ____________ Date: ____________ Movements: ____________ Start time: ____________ Dawn MartinFinish time: ____________ Date: ____________ Movements: ____________ Start time: ____________ Dawn MartinFinish time: ____________ Date: ____________ Movements: ____________ Start time: ____________ Dawn MartinFinish time: ____________ Date: ____________ Movements: ____________ Start time: ____________ Dawn MartinFinish time: ____________  Date: ____________ Movements: ____________ Start time: ____________ Dawn MartinFinish time: ____________ Date: ____________ Movements:  ____________ Start time: ____________ Dawn MartinFinish time: ____________ Date: ____________ Movements: ____________ Start time: ____________ Dawn MartinFinish time: ____________ Date: ____________ Movements: ____________ Start time: ____________ Dawn MartinFinish time: ____________ Date: ____________ Movements: ____________ Start time: ____________ Dawn MartinFinish time: ____________ Date: ____________ Movements: ____________ Start time: ____________ Dawn MartinFinish time: ____________ Date: ____________ Movements: ____________ Start time: ____________ Dawn MartinFinish time: ____________  Date: ____________ Movements: ____________ Start time: ____________ Dawn MartinFinish time: ____________ Date: ____________ Movements: ____________ Start time: ____________ Dawn MartinFinish time: ____________ Date: ____________ Movements: ____________ Start time: ____________ Dawn MartinFinish time: ____________ Date: ____________ Movements: ____________ Start time: ____________ Dawn MartinFinish time: ____________ Date: ____________ Movements: ____________ Start time: ____________ Dawn MartinFinish time: ____________ Date: ____________ Movements: ____________ Start time: ____________ Dawn MartinFinish time: ____________ Document Released: 12/31/2006 Document Revised: 11/17/2012 Document Reviewed: 09/27/2012 [employer] Patient Information 2014 [employer].  Bacterial Vaginosis Bacterial vaginosis is an infection of the vagina. It happens when too many of certain germs (bacteria) grow in the vagina. HOME CARE  Take your medicine as told by your doctor.  Finish your medicine even if you start to feel better.  Do not have sex until you finish your medicine and are better.  Tell your sex partner that you have an infection. They should see their doctor for treatment.  Practice safe sex. Use condoms. Have only one sex partner. GET HELP IF:  You are not getting better after 3 days of treatment.  You have more grey fluid (discharge) coming from your vagina than before.  You have more pain than before.  You have a fever. MAKE  SURE YOU:   Understand these instructions.  Will watch your condition.  Will get help right away if you are not doing well or get worse. Document Released: 09/09/2008 Document Revised: 09/21/2013 Document Reviewed: 07/13/2013 [facility] Patient Information 2014 [employer], [place].

## 2014-03-12 NOTE — MAU Note (Signed)
Patient arrived via EMS with complaints of pain and pressure since 6 pm. States she is having sharp pain whenever the baby moves. She doesn't think the pain is related to contractions. Denies vaginal bleeding and gushes of fluid. However does report a clear thin odorous vaginal discharge.

## 2014-03-12 NOTE — MAU Provider Note (Signed)
  History     CSN: [id number]  Arrival date and time: 03/12/14 2148   First Provider Initiated Contact with Patient 03/12/14 2243      Chief Complaint  Patient presents with  . Abdominal Pain   HPI Comments: Pt is a G2P1 at 33wks arrives unannounced w c/o abdominal pain. States it is all over and happens when baby is moving. Denies any ctx or tightening. Denies VB or LOF. GFM. States she was rx flagyl last week for BV but hasn't taken it because the pills make her sick.     Past Medical History  Diagnosis Date  . HPV in female   . Syphilis 2006  . Abnormal Pap smear 01/2009  . BV (bacterial vaginosis) 2010  . H/O cervicitis 2010  . Pelvic pain in female 2010  . Dysplasia 2010  . History of chlamydia 06/05/09  . Itching 09/04/2009  . Weight loss   . H/O gonorrhea 2010  . Irregular periods/menstrual cycles   . H/O varicella     History reviewed. No pertinent past surgical history.  Family History  Problem Relation Age of Onset  . Drug abuse Father   . COPD Maternal Grandmother     Emphysema    History  Substance Use Topics  . Smoking status: Former Games developermoker  . Smokeless tobacco: Not on file  . Alcohol Use: No    Allergies: No Known Allergies  Prescriptions prior to admission  Medication Sig Dispense Refill  . IRON PO Take 1 tablet by mouth 2 (two) times daily.         Review of Systems  Gastrointestinal: Positive for abdominal pain.  All other systems reviewed and are negative.   Physical Exam   Blood pressure 119/78, pulse 86, temperature 97.5 F (36.4 C), temperature source Oral, resp. rate 18, height 5\' 5"  (1.651 m), weight 171 lb (77.565 kg), last menstrual period 07/18/2013, SpO2 100.00%.  Physical Exam  Nursing note and vitals reviewed. Constitutional: She is oriented to person, place, and time. She appears well-developed and well-nourished.  Cardiovascular: Normal rate.   Respiratory: Effort normal.  GI: Soft.  Genitourinary: Vaginal discharge  found.  Spec exam, lg amt grey/white discharge +whiff Cervix firm, cl/th/high   Musculoskeletal: Normal range of motion.  Neurological: She is alert and oriented to person, place, and time. She has normal reflexes.  Skin: Skin is warm and dry.  Psychiatric: She has a normal mood and affect. Her behavior is normal.    MAU Course  Procedures    Assessment and Plan  IUP at 33wks BV - unable to tolerate oral flagyl FHR cat 1 No evidence PTL   rx metrogel x7 HS dc'd home, f/u routine rv'd FKC and PTL, rec no IC x1wk, stressed importance of treating BV   Tavie Haseman M 03/12/2014, 10:54 PM

## 2014-03-13 LAB — CULTURE, OB URINE
CULTURE: NO GROWTH
Colony Count: NO GROWTH

## 2014-03-13 LAB — GC/CHLAMYDIA PROBE AMP
CT Probe RNA: NEGATIVE
GC Probe RNA: NEGATIVE

## 2014-03-20 LAB — OB RESULTS CONSOLE GC/CHLAMYDIA
Chlamydia: NEGATIVE
GC PROBE AMP, GENITAL: NEGATIVE

## 2014-03-20 LAB — OB RESULTS CONSOLE GBS: GBS: NEGATIVE

## 2014-03-27 ENCOUNTER — Encounter (HOSPITAL_COMMUNITY): Payer: Self-pay | Admitting: Anesthesiology

## 2014-03-27 NOTE — Anesthesia Preprocedure Evaluation (Deleted)
Anesthesia Evaluation    Airway       Dental   Pulmonary former smoker,          Cardiovascular     Neuro/Psych    GI/Hepatic   Endo/Other    Renal/GU      Musculoskeletal   Abdominal   Peds  Hematology   Anesthesia Other Findings   Reproductive/Obstetrics                           Anesthesia Physical Anesthesia Plan  ASA:   Anesthesia Plan:    Post-op Pain Management:    Induction:   Airway Management Planned:   Additional Equipment:   Intra-op Plan:   Post-operative Plan:   Informed Consent:   Plan Discussed with:   Anesthesia Plan Comments: (Spoke with patient via phone on 03/27/14 regarding her concerns about having to have facemask O2 and fever/chills during labor with prior epidural.  Reassured her that these are common occurrences and not a "reaction to epidural" as she had feared.  Cannot find any evidence of problem with prior epidural in her Epic chart or records faxed from office.  All of patient's questions answered.  Reassured patient that she should be able to have epidural with upcoming delivery [facility](EDC 04/24/14). Jasmine DecemberA. Calistro Rauf, MD)        Anesthesia Quick Evaluation

## 2014-04-19 ENCOUNTER — Other Ambulatory Visit: Payer: Self-pay | Admitting: Obstetrics and Gynecology

## 2014-04-20 ENCOUNTER — Inpatient Hospital Stay (HOSPITAL_COMMUNITY): Payer: Medicaid Other | Admitting: Anesthesiology

## 2014-04-20 ENCOUNTER — Encounter (HOSPITAL_COMMUNITY): Payer: Medicaid Other | Admitting: Anesthesiology

## 2014-04-20 ENCOUNTER — Encounter (HOSPITAL_COMMUNITY)
Admission: RE | Disposition: A | Discharge status: Home / Self Care | Payer: Self-pay | Source: Ambulatory Visit | Attending: Obstetrics and Gynecology

## 2014-04-20 ENCOUNTER — Encounter (HOSPITAL_COMMUNITY): Payer: Self-pay

## 2014-04-20 ENCOUNTER — Inpatient Hospital Stay (HOSPITAL_COMMUNITY)
Admission: RE | Admit: 2014-04-20 | Discharge: 2014-04-23 | DRG: 774 | Disposition: A | Discharge status: Home / Self Care | Payer: Medicaid Other | Source: Ambulatory Visit | Attending: Obstetrics and Gynecology | Admitting: Obstetrics and Gynecology

## 2014-04-20 DIAGNOSIS — D649 Anemia, unspecified: Secondary | ICD-10-CM | POA: Diagnosis not present

## 2014-04-20 DIAGNOSIS — O98519 Other viral diseases complicating pregnancy, unspecified trimester: Secondary | ICD-10-CM | POA: Diagnosis present

## 2014-04-20 DIAGNOSIS — O9903 Anemia complicating the puerperium: Secondary | ICD-10-CM | POA: Diagnosis not present

## 2014-04-20 DIAGNOSIS — A6000 Herpesviral infection of urogenital system, unspecified: Secondary | ICD-10-CM | POA: Diagnosis present

## 2014-04-20 DIAGNOSIS — O320XX0 Maternal care for unstable lie, not applicable or unspecified: Secondary | ICD-10-CM | POA: Diagnosis present

## 2014-04-20 DIAGNOSIS — Z87891 Personal history of nicotine dependence: Secondary | ICD-10-CM

## 2014-04-20 DIAGNOSIS — Z3490 Encounter for supervision of normal pregnancy, unspecified, unspecified trimester: Secondary | ICD-10-CM

## 2014-04-20 DIAGNOSIS — O328XX0 Maternal care for other malpresentation of fetus, not applicable or unspecified: Principal | ICD-10-CM | POA: Diagnosis present

## 2014-04-20 LAB — CBC
HCT: 34 % — ABNORMAL LOW (ref 36.0–46.0)
HEMOGLOBIN: 11.4 g/dL — AB (ref 12.0–15.0)
MCH: 30.4 pg (ref 26.0–34.0)
MCHC: 33.5 g/dL (ref 30.0–36.0)
MCV: 90.7 fL (ref 78.0–100.0)
Platelets: 156 10*3/uL (ref 150–400)
RBC: 3.75 MIL/uL — ABNORMAL LOW (ref 3.87–5.11)
RDW: 12.3 % (ref 11.5–15.5)
WBC: 7.5 10*3/uL (ref 4.0–10.5)

## 2014-04-20 LAB — OB RESULTS CONSOLE RUBELLA ANTIBODY, IGM: Rubella: IMMUNE

## 2014-04-20 LAB — RPR

## 2014-04-20 SURGERY — Surgical Case
Anesthesia: Spinal

## 2014-04-20 MED ORDER — OXYTOCIN BOLUS FROM INFUSION
500.0000 mL | INTRAVENOUS | Status: DC
  Administered 2014-04-21: 500 mL via INTRAVENOUS

## 2014-04-20 MED ORDER — ACETAMINOPHEN 325 MG PO TABS: 650.0000 mg | ORAL_TABLET | ORAL | Status: DC | PRN

## 2014-04-20 MED ORDER — CEFAZOLIN SODIUM-DEXTROSE 2-3 GM-% IV SOLR
INTRAVENOUS | Status: AC
  Filled 2014-04-20: qty 50

## 2014-04-20 MED ORDER — TERBUTALINE SULFATE 1 MG/ML IJ SOLN: 0.2500 mg | Freq: Once | INTRAMUSCULAR | Status: AC | PRN

## 2014-04-20 MED ORDER — EPHEDRINE 5 MG/ML INJ
10.0000 mg | INTRAVENOUS | Status: DC | PRN
Start: 2014-04-20 — End: 2014-04-23
  Filled 2014-04-20: qty 2

## 2014-04-20 MED ORDER — LACTATED RINGERS IV SOLN: 500.0000 mL | INTRAVENOUS | Status: DC | PRN

## 2014-04-20 MED ORDER — CITRIC ACID-SODIUM CITRATE 334-500 MG/5ML PO SOLN: 30.0000 mL | ORAL | Status: DC | PRN

## 2014-04-20 MED ORDER — OXYTOCIN 10 UNIT/ML IJ SOLN
INTRAMUSCULAR | Status: AC
  Filled 2014-04-20: qty 4

## 2014-04-20 MED ORDER — PHENYLEPHRINE 40 MCG/ML (10ML) SYRINGE FOR IV PUSH (FOR BLOOD PRESSURE SUPPORT)
80.0000 ug | PREFILLED_SYRINGE | INTRAVENOUS | Status: DC | PRN
  Filled 2014-04-20: qty 2

## 2014-04-20 MED ORDER — PHENYLEPHRINE 40 MCG/ML (10ML) SYRINGE FOR IV PUSH (FOR BLOOD PRESSURE SUPPORT)
PREFILLED_SYRINGE | INTRAVENOUS | Status: AC
  Filled 2014-04-20: qty 10

## 2014-04-20 MED ORDER — SODIUM BICARBONATE 8.4 % IV SOLN
INTRAVENOUS | Status: DC | PRN
  Administered 2014-04-20: 5 mL via EPIDURAL

## 2014-04-20 MED ORDER — FLEET ENEMA 7-19 GM/118ML RE ENEM: 1.0000 | ENEMA | RECTAL | Status: DC | PRN

## 2014-04-20 MED ORDER — FENTANYL CITRATE 0.05 MG/ML IJ SOLN
INTRAMUSCULAR | Status: AC
  Filled 2014-04-20: qty 2

## 2014-04-20 MED ORDER — NALBUPHINE HCL 10 MG/ML IJ SOLN
5.0000 mg | INTRAMUSCULAR | Status: DC | PRN
  Filled 2014-04-20: qty 1

## 2014-04-20 MED ORDER — FENTANYL 2.5 MCG/ML BUPIVACAINE 1/10 % EPIDURAL INFUSION (WH - ANES): 14.0000 mL/h | INTRAMUSCULAR | Status: DC | PRN

## 2014-04-20 MED ORDER — NALBUPHINE HCL 10 MG/ML IJ SOLN
5.0000 mg | INTRAMUSCULAR | Status: DC | PRN
  Administered 2014-04-20: 5 mg via INTRAVENOUS
  Administered 2014-04-21: 10 mg via INTRAVENOUS
  Filled 2014-04-20: qty 1

## 2014-04-20 MED ORDER — FENTANYL 2.5 MCG/ML BUPIVACAINE 1/10 % EPIDURAL INFUSION (WH - ANES)
INTRAMUSCULAR | Status: AC
  Filled 2014-04-20: qty 125

## 2014-04-20 MED ORDER — OXYCODONE-ACETAMINOPHEN 5-325 MG PO TABS
1.0000 | ORAL_TABLET | ORAL | Status: DC | PRN
  Administered 2014-04-22 (×2): 2 via ORAL
  Filled 2014-04-20 (×2): qty 2

## 2014-04-20 MED ORDER — IBUPROFEN 600 MG PO TABS
600.0000 mg | ORAL_TABLET | Freq: Four times a day (QID) | ORAL | Status: DC | PRN
  Administered 2014-04-21: 600 mg via ORAL
  Filled 2014-04-20: qty 1

## 2014-04-20 MED ORDER — ONDANSETRON HCL 4 MG/2ML IJ SOLN
INTRAMUSCULAR | Status: AC
  Filled 2014-04-20: qty 2

## 2014-04-20 MED ORDER — LIDOCAINE HCL (PF) 1 % IJ SOLN
30.0000 mL | INTRAMUSCULAR | Status: DC | PRN
  Filled 2014-04-20: qty 30

## 2014-04-20 MED ORDER — CEFAZOLIN SODIUM-DEXTROSE 2-3 GM-% IV SOLR: 2.0000 g | Freq: Once | INTRAVENOUS | Status: DC

## 2014-04-20 MED ORDER — LACTATED RINGERS IV SOLN
Freq: Once | INTRAVENOUS | Status: AC
  Administered 2014-04-20: 13:00:00 via INTRAVENOUS

## 2014-04-20 MED ORDER — EPHEDRINE 5 MG/ML INJ
10.0000 mg | INTRAVENOUS | Status: DC | PRN
  Filled 2014-04-20: qty 2

## 2014-04-20 MED ORDER — EPHEDRINE 5 MG/ML INJ
INTRAVENOUS | Status: AC
  Filled 2014-04-20: qty 4

## 2014-04-20 MED ORDER — FENTANYL 2.5 MCG/ML BUPIVACAINE 1/10 % EPIDURAL INFUSION (WH - ANES)
14.0000 mL/h | INTRAMUSCULAR | Status: DC | PRN
  Administered 2014-04-20 – 2014-04-21 (×2): 14 mL/h via EPIDURAL
  Filled 2014-04-20: qty 125

## 2014-04-20 MED ORDER — DIPHENHYDRAMINE HCL 50 MG/ML IJ SOLN
12.5000 mg | INTRAMUSCULAR | Status: AC | PRN
  Administered 2014-04-20 (×3): 12.5 mg via INTRAVENOUS
  Filled 2014-04-20: qty 1

## 2014-04-20 MED ORDER — MORPHINE SULFATE 0.5 MG/ML IJ SOLN
INTRAMUSCULAR | Status: AC
  Filled 2014-04-20: qty 10

## 2014-04-20 MED ORDER — SCOPOLAMINE 1 MG/3DAYS TD PT72
1.0000 | MEDICATED_PATCH | TRANSDERMAL | Status: DC
  Administered 2014-04-20: 1.5 mg via TRANSDERMAL

## 2014-04-20 MED ORDER — OXYTOCIN 40 UNITS IN LACTATED RINGERS INFUSION - SIMPLE MED
1.0000 m[IU]/min | INTRAVENOUS | Status: DC
  Administered 2014-04-20: 2 m[IU]/min via INTRAVENOUS
  Administered 2014-04-20: 6 m[IU]/min via INTRAVENOUS
  Administered 2014-04-20: 4 m[IU]/min via INTRAVENOUS
  Filled 2014-04-20: qty 1000

## 2014-04-20 MED ORDER — PHENYLEPHRINE 8 MG IN D5W 100 ML (0.08MG/ML) PREMIX OPTIME
INJECTION | INTRAVENOUS | Status: AC
  Filled 2014-04-20: qty 100

## 2014-04-20 MED ORDER — LACTATED RINGERS IV SOLN
500.0000 mL | Freq: Once | INTRAVENOUS | Status: AC
  Administered 2014-04-20: 500 mL via INTRAVENOUS

## 2014-04-20 MED ORDER — OXYTOCIN 40 UNITS IN LACTATED RINGERS INFUSION - SIMPLE MED: 62.5000 mL/h | INTRAVENOUS | Status: DC

## 2014-04-20 MED ORDER — ONDANSETRON HCL 4 MG/2ML IJ SOLN
4.0000 mg | Freq: Four times a day (QID) | INTRAMUSCULAR | Status: DC | PRN
  Administered 2014-04-21: 4 mg via INTRAVENOUS
  Filled 2014-04-20: qty 2

## 2014-04-20 MED ORDER — SCOPOLAMINE 1 MG/3DAYS TD PT72
MEDICATED_PATCH | TRANSDERMAL | Status: AC
  Administered 2014-04-20: 1.5 mg via TRANSDERMAL
  Filled 2014-04-20: qty 1

## 2014-04-20 MED ORDER — LACTATED RINGERS IV SOLN
INTRAVENOUS | Status: DC
  Administered 2014-04-20 – 2014-04-21 (×4): via INTRAVENOUS

## 2014-04-20 SURGICAL SUPPLY — 36 items
APL SKNCLS STERI-STRIP NONHPOA (GAUZE/BANDAGES/DRESSINGS)
BENZOIN TINCTURE PRP APPL 2/3 (GAUZE/BANDAGES/DRESSINGS) ×1 IMPLANT
BOOTIES KNEE HIGH SLOAN (MISCELLANEOUS) ×2 IMPLANT
CLAMP CORD UMBIL (MISCELLANEOUS) IMPLANT
CLOTH BEACON ORANGE TIMEOUT ST (SAFETY) ×1 IMPLANT
DRAIN JACKSON PRT FLT 10 (DRAIN) IMPLANT
DRAPE LG THREE QUARTER DISP (DRAPES) IMPLANT
DRSG OPSITE POSTOP 4X10 (GAUZE/BANDAGES/DRESSINGS) ×1 IMPLANT
DURAPREP 26ML APPLICATOR (WOUND CARE) ×1 IMPLANT
ELECT REM PT RETURN 9FT ADLT (ELECTROSURGICAL)
ELECTRODE REM PT RTRN 9FT ADLT (ELECTROSURGICAL) ×1 IMPLANT
EVACUATOR SILICONE 100CC (DRAIN) IMPLANT
EXTRACTOR VACUUM M CUP 4 TUBE (SUCTIONS) IMPLANT
GLOVE BIOGEL PI IND STRL 7.0 (GLOVE) ×1 IMPLANT
GLOVE BIOGEL PI INDICATOR 7.0 (GLOVE)
GLOVE ECLIPSE 6.5 STRL STRAW (GLOVE) ×1 IMPLANT
GOWN STRL REUS W/TWL LRG LVL3 (GOWN DISPOSABLE) ×2 IMPLANT
KIT ABG SYR 3ML LUER SLIP (SYRINGE) IMPLANT
NDL HYPO 25X5/8 SAFETYGLIDE (NEEDLE) IMPLANT
NEEDLE HYPO 22GX1.5 SAFETY (NEEDLE) ×1 IMPLANT
NEEDLE HYPO 25X5/8 SAFETYGLIDE (NEEDLE) IMPLANT
NS IRRIG 1000ML POUR BTL (IV SOLUTION) ×2 IMPLANT
PACK C SECTION WH (CUSTOM PROCEDURE TRAY) ×1 IMPLANT
PAD OB MATERNITY 4.3X12.25 (PERSONAL CARE ITEMS) ×1 IMPLANT
RTRCTR C-SECT PINK 25CM LRG (MISCELLANEOUS) ×1 IMPLANT
STRIP CLOSURE SKIN 1/2X4 (GAUZE/BANDAGES/DRESSINGS) ×1 IMPLANT
SUT CHROMIC GUT AB #0 18 (SUTURE) IMPLANT
SUT MNCRL AB 3-0 PS2 27 (SUTURE) ×1 IMPLANT
SUT SILK 2 0 FSL 18 (SUTURE) IMPLANT
SUT VIC AB 0 CTX 36 (SUTURE)
SUT VIC AB 0 CTX36XBRD ANBCTRL (SUTURE) ×2 IMPLANT
SUT VIC AB 1 CT1 36 (SUTURE) ×2 IMPLANT
SYR 20CC LL (SYRINGE) ×1 IMPLANT
TOWEL OR 17X24 6PK STRL BLUE (TOWEL DISPOSABLE) ×1 IMPLANT
TRAY FOLEY CATH 14FR (SET/KITS/TRAYS/PACK) ×1 IMPLANT
WATER STERILE IRR 1000ML POUR (IV SOLUTION) ×1 IMPLANT

## 2014-04-20 NOTE — Progress Notes (Signed)
  Subjective: Patient comfortable with epidural.  Reports some itching and requests relief.   Objective: BP 108/93  Pulse 91  Temp(Src) 97.8 F (36.6 C) (Oral)  Resp 18  Ht 5\' 5"  (1.651 m)  Wt 184 lb (83.462 kg)  BMI 30.62 kg/m2  SpO2 100%  LMP 07/18/2013      FHT: 135 bpm, Mod Var, -Decels, +Accels UC:   Q 1-802min, moderate to palpation SVE:   Dilation: 3 Effacement (%): 50 Station: -2 Exam by:: Stpehanie Montroy CNM Pitocin at 12mUn AROM: Large amt clear fluid IUPC placed  Assessment:  IUP at  39.3wks Cat I FT Pitocin Induction Amniotomy  Plan: Continue present mgmt Hold pitocin at 12mUn and increase only if patient becomes inadequate.   Gerrit HeckJessica Genia Perin, CNM 04/20/2014, 8:10 PM

## 2014-04-20 NOTE — Progress Notes (Signed)
Bedside ultrasound confirms vertex presentation! 4 confirmed change of positions in the last week: variable lie Patient was offered D/C home with expectant management which includes possibility of abnormal lie when goes in labor or elective induction of labor. Procedure, R&B discussed including increased risk of cesarean section. Patient desires to proceed with IOL. Will transfer to room 160

## 2014-04-20 NOTE — Progress Notes (Signed)
Pt report called to Charge RN L&D.  Pt walked to room 160.Dawn Connell MeisingerRN

## 2014-04-20 NOTE — Progress Notes (Signed)
Patient c/o itching despite 3 doses of ordered Benadryl. CNM Emly made aware. Anesthesia was notified and Dr. Rodman Pickleassidy gave orders for Nubain. Anesthesia also made aware of patient's blood pressure running 90's/50's. Patient not symptomatic and babys FHR tracing is stable and reactive at this time.

## 2014-04-20 NOTE — H&P (Signed)
Dawn Barnett is a 23 y.o. female, G2P1001 at 9565w3d, presenting for scheduled c/s for footling breech.  Denies VB, UCs, recent fever, resp or GI c/o's, UTI or PIH s/s. GFM.   Patient Active Problem List   Diagnosis Date Noted  . Gonorrhea complicating pregnancy 09/12/2013  . Smoker 09/12/2013  . Hyperemesis arising during pregnancy 09/12/2013  . Hx of syphilis 09/12/2013  . 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 Hx of chlamydia infection 09/12/2013  Breech presentation Valtrex started at 34 weeks  History of present pregnancy: Patient entered care at 9 weeks.   EDC of 04/24/14 was established by LMP.   Anatomy scan:  18 weeks, limited with normal findings and an posterior placenta.  2522w0d u/s completed anatomy scan - normal findings Additional US evaluations:  39 weeks - footing breech, right leg up, left leg down, posterior placenta, normal fluid.   Significant prenatal events:  none   Last evaluation:  04/18/14 at 3455w1d  0cm / 0% / -4  Breech  OB History   Grav Para Term Preterm Abortions TAB SAB Ect Mult Living   2 1 1       1      Past Medical History  Diagnosis Date  . HPV in female   . Syphilis 2006  . Abnormal Pap smear 01/2009  . BV (bacterial vaginosis) 2010  . H/O cervicitis 2010  . Pelvic pain in female 2010  . Dysplasia 2010  . History of chlamydia 06/05/09  . Itching 09/04/2009  . Weight loss   . H/O gonorrhea 2010  . Irregular periods/menstrual cycles   . H/O varicella    No past surgical history on file. Family History: family history includes COPD in her maternal grandmother; Drug abuse in her father. Social History:  reports that she has quit smoking. She does not have any smokeless tobacco history on file. She reports that she does not drink alcohol or use illicit drugs.   Prenatal Transfer Tool  Maternal Diabetes: No Genetic Screening: Declined Maternal Ultrasounds/Referrals: Normal Fetal Ultrasounds or other Referrals:  None Maternal Substance Abuse:   No Significant Maternal Medications:  None Significant Maternal Lab Results: Lab values include: Group B Strep negative    ROS: see HPI above, all other systems are negative   No Known Allergies     Last menstrual period 07/18/2013.  Chest clear Heart RRR without murmur Abd gravid, NT Ext: WNL   Prenatal labs: ABO, Rh:  B pos Antibody:  Neg Rubella:   Immune RPR:   Neg HBsAg:   Neg HIV:   Neg GBS:  Neg Sickle cell/Hgb electrophoresis:  n/a Pap:  09/19/14 normal GC:  Neg Chlamydia:  Neg Genetic screenings:  declined Glucola:  114 Other:  none    Assessment IUP at 165w3d C/S for breech presentation GBS neg  [facility] Dr. Estanislado Pandyivard Routine [facility] pre-op orders R&B of c/s were reviewed with the patient, including bleeding, infection, and damage to other organs -- patient verbalizes understanding of these risks and wishes to proceed    Gray BernhardtJennifer OxleyCNM, MSN 04/20/2014, 10:57 AM

## 2014-04-20 NOTE — Anesthesia Preprocedure Evaluation (Deleted)
Anesthesia Evaluation  Patient identified by MRN, date of birth, ID band Patient awake    Reviewed: Allergy & Precautions, H&P , Patient's Chart, lab work & pertinent test results  Airway Mallampati: II TM Distance: >3 FB Neck ROM: full    Dental  (+) Teeth Intact   Pulmonary former smoker,  breath sounds clear to auscultation        Cardiovascular Rhythm:regular Rate:Normal     Neuro/Psych    GI/Hepatic   Endo/Other    Renal/GU      Musculoskeletal   Abdominal   Peds  Hematology   Anesthesia Other Findings       Reproductive/Obstetrics (+) Pregnancy                           Anesthesia Physical Anesthesia Plan  ASA: II  Anesthesia Plan: Spinal   Post-op Pain Management:    Induction:   Airway Management Planned:   Additional Equipment:   Intra-op Plan:   Post-operative Plan:   Informed Consent: I have reviewed the patients History and Physical, chart, labs and discussed the procedure including the risks, benefits and alternatives for the proposed anesthesia with the patient or authorized representative who has indicated his/her understanding and acceptance.   Dental Advisory Given  Plan Discussed with: CRNA  Anesthesia Plan Comments: (Lab work confirmed with CRNA in room. Platelets okay. Discussed spinal anesthetic, and patient consents to the procedure:  included risk of possible headache,backache, failed block, allergic reaction, and nerve injury. This patient was asked if she had any questions or concerns before the procedure started. )        Anesthesia Quick Evaluation

## 2014-04-20 NOTE — Anesthesia Procedure Notes (Signed)

## 2014-04-20 NOTE — Anesthesia Preprocedure Evaluation (Signed)
Anesthesia Evaluation  Patient identified by MRN, date of birth, ID band Patient awake    Reviewed: Allergy & Precautions, H&P , Patient's Chart, lab work & pertinent test results  Airway Mallampati: II TM Distance: >3 FB Neck ROM: full    Dental  (+) Teeth Intact   Pulmonary former smoker,  breath sounds clear to auscultation        Cardiovascular Rhythm:regular Rate:Normal     Neuro/Psych    GI/Hepatic   Endo/Other    Renal/GU      Musculoskeletal   Abdominal   Peds  Hematology   Anesthesia Other Findings       Reproductive/Obstetrics (+) Pregnancy                           Anesthesia Physical  Anesthesia Plan  ASA: II  Anesthesia Plan: Epidural   Post-op Pain Management:    Induction:   Airway Management Planned:   Additional Equipment:   Intra-op Plan:   Post-operative Plan:   Informed Consent: I have reviewed the patients History and Physical, chart, labs and discussed the procedure including the risks, benefits and alternatives for the proposed anesthesia with the patient or authorized representative who has indicated his/her understanding and acceptance.   Dental Advisory Given  Plan Discussed with: CRNA  Anesthesia Plan Comments: (Labs checked- platelets confirmed with RN in room. Fetal heart tracing, per RN, reported to be stable enough for sitting procedure. Discussed epidural, and patient consents to the procedure:  included risk of possible headache,backache, failed block, allergic reaction, and nerve injury. This patient was asked if she had any questions or concerns before the procedure started.)        Anesthesia Quick Evaluation

## 2014-04-20 NOTE — Interval H&P Note (Signed)
History and Physical Interval Note:  04/20/2014 1:01 PM  Dawn Barnett  has presented today for surgery, with the diagnosis of Footling breech Presentation  The various methods of treatment have been discussed with the patient and family. After consideration of risks, benefits and other options for treatment, the patient has consented to  Procedure(s): CESAREAN SECTION (N/A) as a surgical intervention .  The patient's history has been reviewed, patient examined, no change in status, stable for surgery.  I have reviewed the patient's chart and labs.  Questions were answered to the patient's satisfaction.     Dois DavenportSandra A Missey Hasley

## 2014-04-20 NOTE — Progress Notes (Signed)
  Subjective: Patient reporting some discomfort with contractions.  Itching has improved since nubain dosage.    Objective: BP 100/61  Pulse 79  Temp(Src) 97.6 F (36.4 C) (Oral)  Resp 16  Ht 5\' 5"  (1.651 m)  Wt 184 lb (83.462 kg)  BMI 30.62 kg/m2  SpO2 100%  LMP 07/18/2013      FHT:  135 bpm, Mod Var, -Decels, +Accels UC:   Q 1-234min, MVUs 135mmHg SVE:   Dilation: 5 Effacement (%): 80 Station: -1 Exam by:: Lety Cullens CNM Bloody show Pitocin at 2722mUn/min  Assessment:  IUP at 39.3wks Cat I FT Pitocin Induction  Plan: Continue present mgmt Reassess prn Dr. Lance MorinA. Roberts updated on patient status. Gerrit HeckJessica Jemima Petko, CNM 04/20/2014, 11:36 PM

## 2014-04-20 NOTE — Progress Notes (Signed)
  Subjective: Pt requests an epidural, she is having to breath through each UC.  Family at bedside and supportive.  Objective: BP 129/72  Pulse 88  Temp(Src) 98.5 F (36.9 C) (Oral)  Resp 18  Ht 5\' 5"  (1.651 m)  Wt 184 lb (83.462 kg)  BMI 30.62 kg/m2  SpO2 100%  LMP 07/18/2013      FHT: Category I UC:   regular, every 3 minutes SVE:   Dilation: 2 Effacement (%): 50 Station: -2 Exam by:: Haroldine LawsJennifer Junaid Wurzer, CNM   Induction of labor due to unstable lie,  progressing well on pitocin  Labor: Progressing on Pitocin, will continue to increase then AROM  Preeclampsia: BP WNL  Fetal Wellbeing: Category I  Pain Control: Epidural  I/D: GBS neg; Intact; Afebrile  Anticipated MOD: NSVD    Dawn Barnett 03/18/2014, 7:23 AM

## 2014-04-20 NOTE — Progress Notes (Signed)
  Subjective: R&B of pitocin induction were reviewed with the patient, patient verbalizes understanding of these risks and wishes to proceed   Family at bedside and supportive.  Objective: BP 118/67  Pulse 77  Temp(Src) 97.7 F (36.5 C) (Oral)  Resp 18  Ht 5\' 5"  (1.651 m)  Wt 184 lb (83.462 kg)  BMI 30.62 kg/m2  SpO2 96%  LMP 07/18/2013      FHT: Cat I UC:   none SVE:   Dilation:  (unable to detemine vertex) Vertex confirmed by bedside u/s   Induction of labor due to unstable lie,  initiate pitocin  Labor: Continue Pitocin, will continue to increase then AROM  Preeclampsia: BP stable  Fetal Wellbeing: Category I  Pain Control: Pt plans epidural   I/D: GBS neg; Intact; Afebrile  Anticipated MOD: NSVD    Tamsin Nader 03/18/2014, 7:23 AM

## 2014-04-21 ENCOUNTER — Encounter (HOSPITAL_COMMUNITY): Payer: Self-pay

## 2014-04-21 MED ORDER — WITCH HAZEL-GLYCERIN EX PADS: 1.0000 "application " | MEDICATED_PAD | CUTANEOUS | Status: DC | PRN

## 2014-04-21 MED ORDER — SIMETHICONE 80 MG PO CHEW: 80.0000 mg | CHEWABLE_TABLET | ORAL | Status: DC | PRN

## 2014-04-21 MED ORDER — DIPHENHYDRAMINE HCL 25 MG PO CAPS: 25.0000 mg | ORAL_CAPSULE | Freq: Four times a day (QID) | ORAL | Status: DC | PRN

## 2014-04-21 MED ORDER — ZOLPIDEM TARTRATE 5 MG PO TABS: 5.0000 mg | ORAL_TABLET | Freq: Every evening | ORAL | Status: DC | PRN

## 2014-04-21 MED ORDER — BENZOCAINE-MENTHOL 20-0.5 % EX AERO: 1.0000 "application " | INHALATION_SPRAY | CUTANEOUS | Status: DC | PRN

## 2014-04-21 MED ORDER — MISOPROSTOL 200 MCG PO TABS
1000.0000 ug | ORAL_TABLET | Freq: Once | ORAL | Status: AC
Start: 2014-04-21 — End: 2014-04-21
  Administered 2014-04-21: 1000 ug via RECTAL

## 2014-04-21 MED ORDER — COMPLETENATE 29-1 MG PO CHEW
1.0000 | CHEWABLE_TABLET | Freq: Every day | ORAL | Status: DC
  Administered 2014-04-21: 1 via ORAL
  Filled 2014-04-21 (×4): qty 1

## 2014-04-21 MED ORDER — IBUPROFEN 100 MG/5ML PO SUSP
600.0000 mg | Freq: Four times a day (QID) | ORAL | Status: DC
  Administered 2014-04-21 – 2014-04-23 (×7): 600 mg via ORAL
  Filled 2014-04-21 (×12): qty 30

## 2014-04-21 MED ORDER — PRENATAL MULTIVITAMIN CH
1.0000 | ORAL_TABLET | Freq: Every day | ORAL | Status: DC
  Filled 2014-04-21: qty 1

## 2014-04-21 MED ORDER — OXYCODONE-ACETAMINOPHEN 5-325 MG PO TABS
1.0000 | ORAL_TABLET | ORAL | Status: DC | PRN
  Administered 2014-04-21 – 2014-04-23 (×8): 2 via ORAL
  Filled 2014-04-21 (×8): qty 2

## 2014-04-21 MED ORDER — TETANUS-DIPHTH-ACELL PERTUSSIS 5-2.5-18.5 LF-MCG/0.5 IM SUSP: 0.5000 mL | Freq: Once | INTRAMUSCULAR | Status: DC

## 2014-04-21 MED ORDER — DIBUCAINE 1 % RE OINT: 1.0000 "application " | TOPICAL_OINTMENT | RECTAL | Status: DC | PRN

## 2014-04-21 MED ORDER — ONDANSETRON HCL 4 MG PO TABS: 4.0000 mg | ORAL_TABLET | ORAL | Status: DC | PRN

## 2014-04-21 MED ORDER — ONDANSETRON HCL 4 MG/2ML IJ SOLN: 4.0000 mg | INTRAMUSCULAR | Status: DC | PRN

## 2014-04-21 MED ORDER — MISOPROSTOL 200 MCG PO TABS
ORAL_TABLET | ORAL | Status: AC
  Filled 2014-04-21: qty 5

## 2014-04-21 MED ORDER — LANOLIN HYDROUS EX OINT: TOPICAL_OINTMENT | CUTANEOUS | Status: DC | PRN

## 2014-04-21 MED ORDER — SENNOSIDES-DOCUSATE SODIUM 8.6-50 MG PO TABS
2.0000 | ORAL_TABLET | ORAL | Status: DC
  Administered 2014-04-21 – 2014-04-22 (×2): 2 via ORAL
  Filled 2014-04-21 (×2): qty 2

## 2014-04-21 MED ORDER — IBUPROFEN 600 MG PO TABS
600.0000 mg | ORAL_TABLET | Freq: Four times a day (QID) | ORAL | Status: DC
  Administered 2014-04-21: 600 mg via ORAL
  Filled 2014-04-21: qty 1

## 2014-04-21 NOTE — Progress Notes (Signed)
  Subjective: Decelerations noted on fetal tracing.  Patient b/p decreased.  Objective: BP 95/57  Pulse 75  Temp(Src) 98.3 F (36.8 C) (Oral)  Resp 18  Ht 5\' 5"  (1.651 m)  Wt 184 lb (83.462 kg)  BMI 30.62 kg/m2  SpO2 100%  LMP 07/18/2013      FHT:  140 bpm, Mod Var, Late Decels, +Accels UC:   Q 1-545min, moderate to palpation SVE:   Dilation: 5 Effacement (%): 80 Station: -1 Exam by:: Fabiano Ginley CNM Pitocin at 4924mUn/min  Assessment:  IUP at 39.4wks Cat II FT Decreased B/P Induction of Labor  Plan: Position changes and fluid bolus-decels resolved Nurse to contact anesthesia regarding bp issues Continue other mgmt as ordered Will reassess in 2 hrs or prn  Gerrit HeckJessica Lainee Lehrman, CNM 04/21/2014, 1:14 AM

## 2014-04-21 NOTE — Progress Notes (Signed)
Subjective: Postpartum Day 0: Vaginal delivery, no lacerations Patient up ad lib, reports no syncope or dizziness. Feeding:  Bottle Contraceptive plan: Undecided  Objective: Vital signs in last 24 hours: Temp:  [97.4 F (36.3 C)-99.7 F (37.6 C)] 98.5 F (36.9 C) (05/08 1216) Pulse Rate:  [62-118] 71 (05/08 1216) Resp:  [16-20] 18 (05/08 1216) BP: (91-136)/(32-93) 125/78 mmHg (05/08 1216) SpO2:  [98 %-100 %] 100 % (05/08 1216)  Physical Exam:  General: alert Lochia: appropriate Uterine Fundus: firm Perineum: Intact DVT Evaluation: No evidence of DVT seen on physical exam.    Recent Labs  04/20/14 1210  HGB 11.4*  HCT 34.0*    Assessment/Plan: Status post vaginal delivery day 0 Stable Continue current care.     Dawn Barnett 04/21/2014, 2:37 PM

## 2014-04-21 NOTE — Progress Notes (Signed)
  Subjective: Nurse called providing update per anesthesia--give bolus and phenylephine.  Patient remains comfortable with epidural.   Objective: BP 112/72  Pulse 79  Temp(Src) 98.9 F (37.2 C) (Oral)  Resp 16  Ht 5\' 5"  (1.651 m)  Wt 184 lb (83.462 kg)  BMI 30.62 kg/m2  SpO2 100%  LMP 07/18/2013      FHT:  145 bpm, Mod Var, Late Decels, +Accels UC:   Q 2-334min, MVUs 160 mmHg SVE:   Dilation: 7.5 Effacement (%): 90 Station: 0 Exam by:: Amarien Carne CNM Pitocin at 4424mUn/min  Assessment:  IUP at 39.4wks Cat II FT  Active Labor Pitocin Induction  Plan: Position changes, fluid bolus Medication as rx by anesthesia Continue other mgmt as ordered Will reassess in 2 hrs or prn  Gerrit HeckJessica Faithe Ariola, CNM 04/21/2014, 2:52 AM

## 2014-04-21 NOTE — Anesthesia Postprocedure Evaluation (Signed)
  Anesthesia Post-op Note  Anesthesia Post Note  Patient: Dawn Barnett  Procedure(s) Performed: * No procedures listed *  Anesthesia type: Epidural  Patient location: Dawn Barnett  Post pain: Pain level controlled  Post assessment: Post-op Vital signs reviewed  Last Vitals:  Filed Vitals:   04/21/14 0800  BP: 110/69  Pulse: 69  Temp: 37.2 C  Resp: 20    Post vital signs: Reviewed  Level of consciousness:alert  Complications: No apparent anesthesia complications

## 2014-04-22 LAB — CBC
HCT: 29.6 % — ABNORMAL LOW (ref 36.0–46.0)
Hemoglobin: 10 g/dL — ABNORMAL LOW (ref 12.0–15.0)
MCH: 31 pg (ref 26.0–34.0)
MCHC: 33.8 g/dL (ref 30.0–36.0)
MCV: 91.6 fL (ref 78.0–100.0)
Platelets: 140 10*3/uL — ABNORMAL LOW (ref 150–400)
RBC: 3.23 MIL/uL — AB (ref 3.87–5.11)
RDW: 12.4 % (ref 11.5–15.5)
WBC: 10.4 10*3/uL (ref 4.0–10.5)

## 2014-04-22 MED ORDER — FERROUS SULFATE 325 (65 FE) MG PO TABS: 325.0000 mg | ORAL_TABLET | Freq: Every day | ORAL | Status: DC

## 2014-04-22 NOTE — Progress Notes (Signed)
Dawn Barnett  Post Partum Day 1:S/P SVD  Subjective: Patient up ad lib, denies syncope or dizziness. Patient reports feeling "sore" and managing pain with percocet and ibuprofen.  Reflecting positively on labor and birth experience Feeding:  Bottle Contraceptive plan:  Nexplanon  Objective: Blood pressure 113/79, pulse 61, temperature 97.6 F (36.4 C), temperature source Oral, resp. rate 18, height 5\' 5"  (1.651 m), weight 184 lb (83.462 kg), last menstrual period 07/18/2013, SpO2 99.00%, unknown if currently breastfeeding.  Physical Exam:  General: alert, cooperative and no distress Lochia: appropriate Uterine Fundus: firm, U/-1 Incision: N/A DVT Evaluation: No evidence of DVT seen on physical exam. Negative Homan's sign.   Recent Labs  04/20/14 1210 04/22/14 0619  HGB 11.4* 10.0*  HCT 34.0* 29.6*    Assessment S/P SVD Day 1 Normal Involution Bottlefeeding Asymptomatic Anemia  Plan: Iron supplementation Vita BarleyQDaily w/ breakfast Continue current care Plan for discharge tomorrow  Gerrit HeckJessica Suzanne Barnett, CNM 04/22/2014, 9:47 AM

## 2014-04-23 MED ORDER — IBUPROFEN 600 MG PO TABS: 600.0000 mg | ORAL_TABLET | Freq: Four times a day (QID) | ORAL | Status: DC | PRN

## 2014-04-23 MED ORDER — OXYCODONE-ACETAMINOPHEN 5-325 MG PO TABS: 1.0000 | ORAL_TABLET | ORAL | Status: DC | PRN

## 2014-04-23 MED ORDER — FERROUS SULFATE 325 (65 FE) MG PO TABS: 325.0000 mg | ORAL_TABLET | Freq: Every day | ORAL | Status: DC

## 2014-04-23 NOTE — Discharge Instructions (Signed)
Postpartum Depression and Baby Blues °The postpartum period begins right after the birth of a baby. During this time, there is often a great amount of joy and excitement. It is also a time of considerable changes in the life of the parent(s). Regardless of how many times a mother gives birth, each child brings new challenges and dynamics to the family. It is not unusual to have feelings of excitement accompanied by confusing shifts in moods, emotions, and thoughts. All mothers are at risk of developing postpartum depression or the "baby blues." These mood changes can occur right after giving birth, or they may occur many months after giving birth. The baby blues or postpartum depression can be mild or severe. Additionally, postpartum depression can resolve rather quickly, or it can be a long-term condition. °CAUSES °Elevated hormones and their rapid decline are thought to be a main cause of postpartum depression and the baby blues. There are a number of hormones that radically change during and after pregnancy. Estrogen and progesterone usually decrease immediately after delivering your baby. The level of thyroid hormone and various cortisol steroids also rapidly drop. Other factors that play a major role in these changes include major life events and genetics.  °RISK FACTORS °If you have any of the following risks for the baby blues or postpartum depression, know what symptoms to watch out for during the postpartum period. Risk factors that may increase the likelihood of getting the baby blues or postpartum depression include: °· Having a personal or family history of depression. °· Having depression while being pregnant. °· Having premenstrual or oral contraceptive-associated mood issues. °· Having exceptional life stress. °· Having marital conflict. °· Lacking a social support network. °· Having a baby with special needs. °· Having health problems such as diabetes. °SYMPTOMS °Baby blues symptoms include: °· Brief  fluctuations in mood, such as going from extreme happiness to sadness. °· Decreased concentration. °· Difficulty sleeping. °· Crying spells, tearfulness. °· Irritability. °· Anxiety. °Postpartum depression symptoms typically begin within the first month after giving birth. These symptoms include: °· Difficulty sleeping or excessive sleepiness. °· Marked weight loss. °· Agitation. °· Feelings of worthlessness. °· Lack of interest in activity or food. °Postpartum psychosis is a very concerning condition and can be dangerous. Fortunately, it is rare. Displaying any of the following symptoms is cause for immediate medical attention. Postpartum psychosis symptoms include: °· Hallucinations and delusions. °· Bizarre or disorganized behavior. °· Confusion or disorientation. °DIAGNOSIS  °A diagnosis is made by an evaluation of your symptoms. There are no medical or lab tests that lead to a diagnosis, but there are various questionnaires that a caregiver may use to identify those with the baby blues, postpartum depression, or psychosis. Often times, a screening tool called the Edinburgh Postnatal Depression Scale is used to diagnose depression in the postpartum period.  °TREATMENT °The baby blues usually goes away on its own in 1 to 2 weeks. Social support is often all that is needed. You should be encouraged to get adequate sleep and rest. Occasionally, you may be given medicines to help you sleep.  °Postpartum depression requires treatment as it can last several months or longer if it is not treated. Treatment may include individual or group therapy, medicine, or both to address any social, physiological, and psychological factors that may play a role in the depression. Regular exercise, a healthy diet, rest, and social support may also be strongly recommended.  °Postpartum psychosis is more serious and needs treatment right away. Hospitalization is   often needed. HOME CARE INSTRUCTIONS  Get as much rest as you can. Nap  when the baby sleeps.  Exercise regularly. Some women find yoga and walking to be beneficial.  Eat a balanced and nourishing diet.  Do little things that you enjoy. Have a cup of tea, take a bubble bath, read your favorite magazine, or listen to your favorite music.  Avoid alcohol.  Ask for help with household chores, cooking, grocery shopping, or running errands as needed. Do not try to do everything.  Talk to people close to you about how you are feeling. Get support from your partner, family members, friends, or other new moms.  Try to stay positive in how you think. Think about the things you are grateful for.  Do not spend a lot of time alone.  Only take medicine as directed by your caregiver.  Keep all your postpartum appointments.  Let your caregiver know if you have any concerns. SEEK MEDICAL CARE IF: You are having a reaction or problems with your medicine. SEEK IMMEDIATE MEDICAL CARE IF:  You have suicidal feelings.  You feel you may harm the baby or someone else. Document Released: 09/04/2004 Document Revised: 02/23/2012 Document Reviewed: 09/12/2013 [facility] Patient Information 2014 [place], [place]. Iron-Rich Diet An iron-rich diet contains foods that are good sources of iron. Iron is an important mineral that helps your body produce hemoglobin. Hemoglobin is a protein in red blood cells that carries oxygen to the body's tissues. Sometimes, the iron level in your blood can be low. This may be caused by:  A lack of iron in your diet.  Blood loss.  Times of growth, such as during pregnancy or during a child's growth and development. Low levels of iron can cause a decrease in the number of red blood cells. This can result in iron deficiency anemia. Iron deficiency anemia symptoms include:  Tiredness.  Weakness.  Irritability.  Increased chance of infection. Here are some recommendations for daily iron intake:  Males older than 24 years of age need 8 mg of  iron per day.  Women ages 71 to 74 need 18 mg of iron per day.  Pregnant women need 27 mg of iron per day, and women who are over 94 years of age and breastfeeding need 9 mg of iron per day.  Women over the age of 73 need 8 mg of iron per day. SOURCES OF IRON There are 2 types of iron that are found in food: heme iron and nonheme iron. Heme iron is absorbed by the body better than nonheme iron. Heme iron is found in meat, poultry, and fish. Nonheme iron is found in grains, beans, and vegetables. Heme Iron Sources Food / Iron (mg)  Chicken liver, 3 oz (85 g)/ 10 mg  Beef liver, 3 oz (85 g)/ 5.5 mg  Oysters, 3 oz (85 g)/ 8 mg  Beef, 3 oz (85 g)/ 2 to 3 mg  Shrimp, 3 oz (85 g)/ 2.8 mg  [place], 3 oz (85 g)/ 2 mg  Chicken, 3 oz (85 g) / 1 mg  Fish (tuna, halibut), 3 oz (85 g)/ 1 mg  Pork, 3 oz (85 g)/ 0.9 mg Nonheme Iron Sources Food / Iron (mg)  Ready-to-eat breakfast cereal, iron-fortified / 3.9 to 7 mg  Tofu,  cup / 3.4 mg  Kidney beans,  cup / 2.6 mg  Baked potato with skin / 2.7 mg  Asparagus,  cup / 2.2 mg  Avocado / 2 mg  Dried peaches,  cup / 1.6 mg  Raisins,  cup / 1.5 mg  Soy milk, 1 cup / 1.5 mg  Whole-wheat bread, 1 slice / 1.2 mg  Spinach, 1 cup / 0.8 mg  Broccoli,  cup / 0.6 mg IRON ABSORPTION Certain foods can decrease the body's absorption of iron. Try to avoid these foods and beverages while eating meals with iron-containing foods:  Coffee.  Tea.  Fiber.  Soy. Foods containing vitamin C can help increase the amount of iron your body absorbs from iron sources, especially from nonheme sources. Eat foods with vitamin C along with iron-containing foods to increase your iron absorption. Foods that are high in vitamin C include many fruits and vegetables. Some good sources are:  Fresh orange juice.  Oranges.  Strawberries.  Mangoes.  Grapefruit.  Red bell peppers.  Green bell peppers.  Broccoli.  Potatoes with  skin.  Tomato juice. Document Released: 07/15/2005 Document Revised: 02/23/2012 Document Reviewed: 05/22/2011 [facility] Patient Information 2014 [place]. Intrauterine Device Information An intrauterine device (IUD) is inserted into your uterus to prevent pregnancy. There are two types of IUDs available:   Copper IUD This type of IUD is wrapped in copper wire and is placed inside the uterus. Copper makes the uterus and fallopian tubes produce a fluid that kills sperm. The copper IUD can stay in place for 10 years.  Hormone IUD This type of IUD contains the hormone progestin (synthetic progesterone). The hormone thickens the cervical mucus and prevents sperm from entering the uterus. It also thins the uterine lining to prevent implantation of a fertilized egg. The hormone can weaken or kill the sperm that get into the uterus. One type of hormone IUD can stay in place for 5 years, and another type can stay in place for 3 years. Your health care provider will make sure you are a good candidate for a contraceptive IUD. Discuss with your health care provider the possible side effects.  ADVANTAGES OF AN INTRAUTERINE DEVICE  IUDs are highly effective, reversible, long acting, and low maintenance.   There are no estrogen-related side effects.   An IUD can be used when breastfeeding.   IUDs are not associated with weight gain.   The copper IUD works immediately after insertion.   The hormone IUD works right away if inserted within 7 days of your period starting. You will need to use a backup method of birth control for 7 days if the hormone IUD is inserted at any other time in your cycle.  The copper IUD does not interfere with your female hormones.   The hormone IUD can make heavy menstrual periods lighter and decrease cramping.   The hormone IUD can be used for 3 or 5 years.   The copper IUD can be used for 10 years. DISADVANTAGES OF AN INTRAUTERINE DEVICE  The hormone IUD  can be associated with irregular bleeding patterns.   The copper IUD can make your menstrual flow heavier and more painful.   You may experience cramping and vaginal bleeding after insertion.  Document Released: 11/04/2004 Document Revised: 08/03/2013 Document Reviewed: 05/22/2013 [facility] Patient Information 2014 [place], [place].

## 2014-04-23 NOTE — Discharge Summary (Signed)
Vaginal Delivery Discharge Summary  Dawn Barnett  DOB:    January 03, 1991 MRN:    [id number] CSN:    [id number]  Date of admission:                  04/20/2014   Date of discharge:                   04/23/2014  Procedures this admission:  SVD  Date of Delivery: 04/21/2014  Newborn Data:  Live born female  Birth Weight: 7 lb 3.5 oz (3274 g) APGAR: 7, 8  Home with mother. Name: Dawn Barnett Circumcision Plan: N/A  History of Present Illness:  Ms. Dawn Barnett is a 23 y.o. female, G2P2002, who presents at [redacted]w[redacted]d weeks gestation. The patient has been followed at the [facility]cology division of [facility]. She was admitted induction of labor. Her pregnancy has been complicated by:   Patient Active Problem List   Diagnosis Date Noted      . Gonorrhea complicating pregnancy 09/12/2013  . Smoker 09/12/2013  . Hyperemesis arising during pregnancy 09/12/2013  . Hx of syphilis 09/12/2013  . 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 Hx of chlamydia infection 09/12/2013     Hospital course:  The patient was admitted for primary cesarean section, but upon ultrasound the footling breech fetus was found to be vertex.  Patient was given and accepted the option for induction of labor.  Her labor was not complicated. She proceeded to have a vaginal delivery of a healthy infant. Her delivery attended by Gerrit Heck, CNM and was not complicated. However, patient did require a manual exploration and cytotec for bleeding, but no further interventions.  Her postpartum course was not complicated. However, she was started on ferrous sulfate for asymptomatic anemia She was discharged to home on postpartum day 2 doing well.  Feeding:  bottle  Contraception:  Nexplanon  Discharge hemoglobin:  Hemoglobin  Date Value Ref Range Status  04/22/2014 10.0* 12.0 - 15.0 g/dL Final     HCT  Date Value Ref Range Status  04/22/2014 29.6* 36.0 - 46.0 % Final    Discharge Physical  Exam:   General: alert, cooperative and no distress Lochia: appropriate Uterine Fundus: firm, U/-1 Incision: N/A DVT Evaluation: No evidence of DVT seen on physical exam. Negative Homan's sign.  Intrapartum Procedures: spontaneous vaginal delivery Postpartum Procedures: none Complications-Operative and Postpartum: none  Discharge Diagnoses: Term Pregnancy-delivered, Asymptomatic Anemia  Discharge Information:  Activity:           pelvic rest Diet:                routine Medications: Ibuprofen, Iron and Percocet Condition:      stable Instructions:  Continue Fe+ supplementation until 6 week appt or if normal results are reported after your H/H is checked at your [facility] appt.  Postpartum Care After Vaginal Delivery  After you deliver your newborn (postpartum period), the usual stay in the hospital is 24 72 hours. If there were problems with your labor or delivery, or if you have other medical problems, you might be in the hospital longer.  While you are in the hospital, you will receive help and instructions on how to care for yourself and your newborn during the postpartum period.  While you are in the hospital:  Be sure to tell your nurses if you have pain or discomfort, as well as where you feel the pain and what makes the pain worse.  If  you had an incision made near your vagina (episiotomy) or if you had some tearing during delivery, the nurses may put ice packs on your episiotomy or tear. The ice packs may help to reduce the pain and swelling.  If you are breastfeeding, you may feel uncomfortable contractions of your uterus for a couple of weeks. This is normal. The contractions help your uterus get back to normal size.  It is normal to have some bleeding after delivery.  For the first 1 3 days after delivery, the flow is red and the amount may be similar to a period.  It is common for the flow to start and stop.  In the first few days, you may pass some small clots. Let  your nurses know if you begin to pass large clots or your flow increases.  Do not  flush blood clots down the toilet before having the nurse look at them.  During the next 3 10 days after delivery, your flow should become more watery and pink or brown-tinged in color.  Ten to fourteen days after delivery, your flow should be a small amount of yellowish-white discharge.  The amount of your flow will decrease over the first few weeks after delivery. Your flow may stop in 6 8 weeks. Most women have had their flow stop by 12 weeks after delivery.  You should change your sanitary pads frequently.  Wash your hands thoroughly with soap and water for at least 20 seconds after changing pads, using the toilet, or before holding or feeding your newborn.  You should feel like you need to empty your bladder within the first 6 8 hours after delivery.  In case you become weak, lightheaded, or faint, call your nurse before you get out of bed for the first time and before you take a shower for the first time.  Within the first few days after delivery, your breasts may begin to feel tender and full. This is called engorgement. Breast tenderness usually goes away within 48 72 hours after engorgement occurs. You may also notice milk leaking from your breasts. If you are not breastfeeding, do not stimulate your breasts. Breast stimulation can make your breasts produce more milk.  Spending as much time as possible with your newborn is very important. During this time, you and your newborn can feel close and get to know each other. Having your newborn stay in your room (rooming in) will help to strengthen the bond with your newborn. It will give you time to get to know your newborn and become comfortable caring for your newborn.  Your hormones change after delivery. Sometimes the hormone changes can temporarily cause you to feel sad or tearful. These feelings should not last more than a few days. If these feelings  last longer than that, you should talk to your caregiver.  If desired, talk to your caregiver about methods of family planning or contraception.  Talk to your caregiver about immunizations. Your caregiver may want you to have the following immunizations before leaving the hospital:  Tetanus, diphtheria, and pertussis (Tdap) or tetanus and diphtheria (Td) immunization. It is very important that you and your family (including grandparents) or others caring for your newborn are up-to-date with the Tdap or Td immunizations. The Tdap or Td immunization can help protect your newborn from getting ill.  Rubella immunization.  Varicella (chickenpox) immunization.  Influenza immunization. You should receive this annual immunization if you did not receive the immunization during your pregnancy. Document Released: 09/28/2007  Document Revised: 08/25/2012 Document Reviewed: 07/28/2012 [facility] Patient Information 2014 [place] [place].   Postpartum Depression and Baby Blues  The postpartum period begins right after the birth of a baby. During this time, there is often a great amount of joy and excitement. It is also a time of considerable changes in the life of the parent(s). Regardless of how many times a mother gives birth, each child brings new challenges and dynamics to the family. It is not unusual to have feelings of excitement accompanied by confusing shifts in moods, emotions, and thoughts. All mothers are at risk of developing postpartum depression or the "baby blues." These mood changes can occur right after giving birth, or they may occur many months after giving birth. The baby blues or postpartum depression can be mild or severe. Additionally, postpartum depression can resolve rather quickly, or it can be a long-term condition. CAUSES Elevated hormones and their rapid decline are thought to be a main cause of postpartum depression and the baby blues. There are a number of hormones that radically  change during and after pregnancy. Estrogen and progesterone usually decrease immediately after delivering your baby. The level of thyroid hormone and various cortisol steroids also rapidly drop. Other factors that play a major role in these changes include major life events and genetics.  RISK FACTORS If you have any of the following risks for the baby blues or postpartum depression, know what symptoms to watch out for during the postpartum period. Risk factors that may increase the likelihood of getting the baby blues or postpartum depression include:  Havinga personal or family history of depression.  Having depression while being pregnant.  Having premenstrual or oral contraceptive-associated mood issues.  Having exceptional life stress.  Having marital conflict.  Lacking a social support network.  Having a baby with special needs.  Having health problems such as diabetes. SYMPTOMS Baby blues symptoms include:  Brief fluctuations in mood, such as going from extreme happiness to sadness.  Decreased concentration.  Difficulty sleeping.  Crying spells, tearfulness.  Irritability.  Anxiety. Postpartum depression symptoms typically begin within the first month after giving birth. These symptoms include:  Difficulty sleeping or excessive sleepiness.  Marked weight loss.  Agitation.  Feelings of worthlessness.  Lack of interest in activity or food. Postpartum psychosis is a very concerning condition and can be dangerous. Fortunately, it is rare. Displaying any of the following symptoms is cause for immediate medical attention. Postpartum psychosis symptoms include:  Hallucinations and delusions.  Bizarre or disorganized behavior.  Confusion or disorientation. DIAGNOSIS  A diagnosis is made by an evaluation of your symptoms. There are no medical or lab tests that lead to a diagnosis, but there are various questionnaires that a caregiver may use to identify those with  the baby blues, postpartum depression, or psychosis. Often times, a screening tool called the [place] Postnatal Depression Scale is used to diagnose depression in the postpartum period.  TREATMENT The baby blues usually goes away on its own in 1 to 2 weeks. Social support is often all that is needed. You should be encouraged to get adequate sleep and rest. Occasionally, you may be given medicines to help you sleep.  Postpartum depression requires treatment as it can last several months or longer if it is not treated. Treatment may include individual or group therapy, medicine, or both to address any social, physiological, and psychological factors that may play a role in the depression. Regular exercise, a healthy diet, rest, and social support may also  be strongly recommended.  Postpartum psychosis is more serious and needs treatment right away. Hospitalization is often needed. HOME CARE INSTRUCTIONS  Get as much rest as you can. Nap when the baby sleeps.  Exercise regularly. Some women find yoga and walking to be beneficial.  Eat a balanced and nourishing diet.  Do little things that you enjoy. Have a cup of tea, take a bubble bath, read your favorite magazine, or listen to your favorite music.  Avoid alcohol.  Ask for help with household chores, cooking, grocery shopping, or running errands as needed. Do not try to do everything.  Talk to people close to you about how you are feeling. Get support from your partner, family members, friends, or other new moms.  Try to stay positive in how you think. Think about the things you are grateful for.  Do not spend a lot of time alone.  Only take medicine as directed by your caregiver.  Keep all your postpartum appointments.  Let your caregiver know if you have any concerns. SEEK MEDICAL CARE IF: You are having a reaction or problems with your medicine. SEEK IMMEDIATE MEDICAL CARE IF:  You have suicidal feelings.  You feel you may harm  the baby or someone else. Document Released: 09/04/2004 Document Revised: 02/23/2012 Document Reviewed: 10/07/2011 [facility] Patient Information 2014 [place], [place].   Discharge to: home  Follow-up Information   Follow up with [facility]. Schedule an appointment as soon as possible for a visit in 5 weeks. (Please call if you have any questions or concerns prior to your next visit. )    Specialty:  Obstetrics and Gynecology   Contact information:   [address] [place] [address] [phone]       Gerrit Heck 04/23/2014

## 2014-04-24 NOTE — Progress Notes (Signed)
Post discharge ur review completed. 

## 2014-06-25 IMAGING — US US OB COMP LESS 14 WK
1 series · 14 of 18 positions shown · non-contrast
Comparison: None

CLINICAL DATA: 22-year-old pregnant female - unable to detect fetal
heart tones on exam. Estimated gestational age of 10 weeks 3 days by
LMP.

EXAM:
OBSTETRIC <14 WK ULTRASOUND
TECHNIQUE: Transabdominal ultrasound was performed for evaluation of the
gestation as well as the maternal uterus and adnexal regions.

[Series 1: us ob transvaginal · 18 acquisitions, 14 frames shown]
[im 1/18]
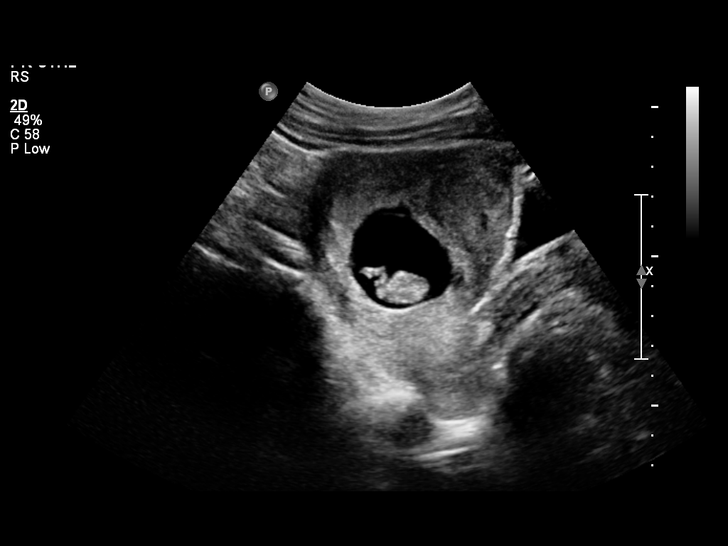
[im 2/18]
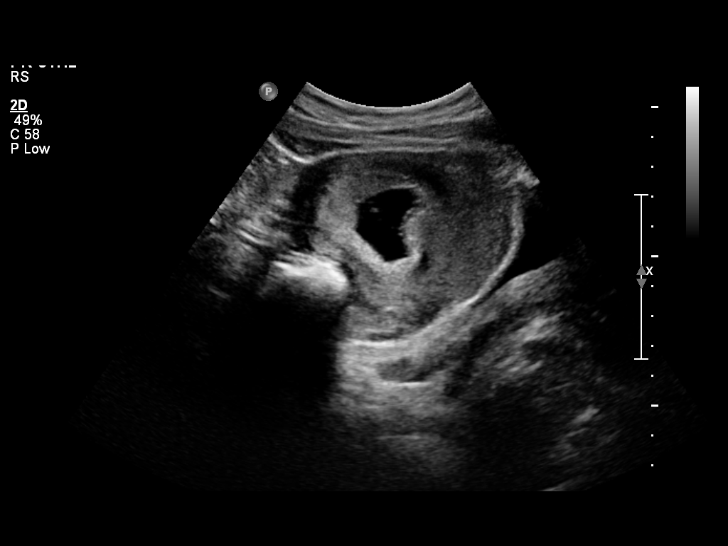
[im 4/18]
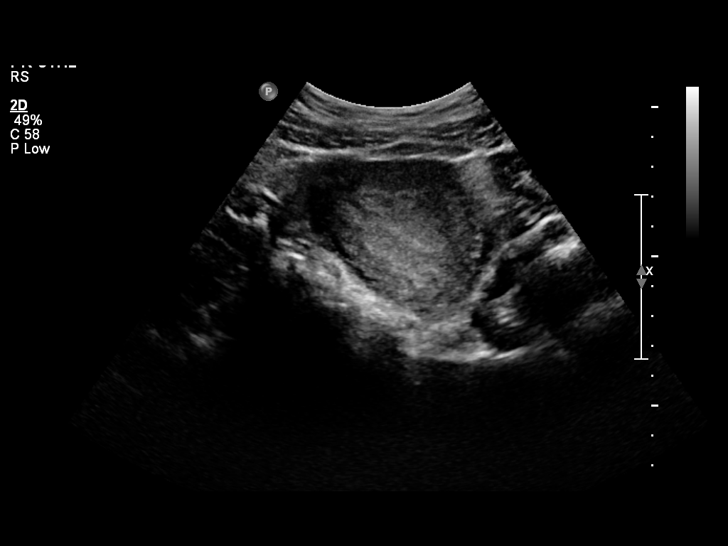
[im 5/18]
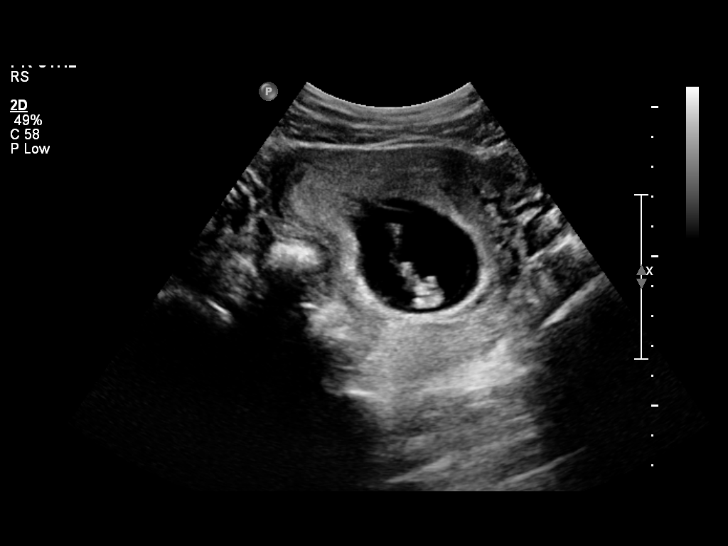
[im 6/18]
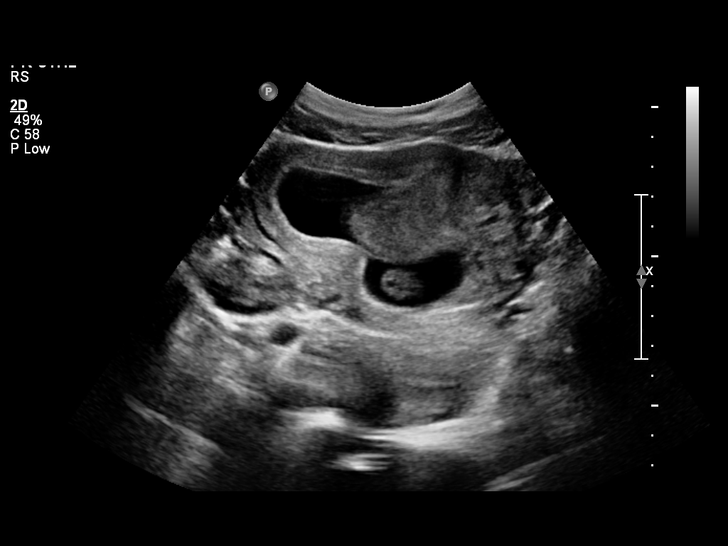
[im 8/18]
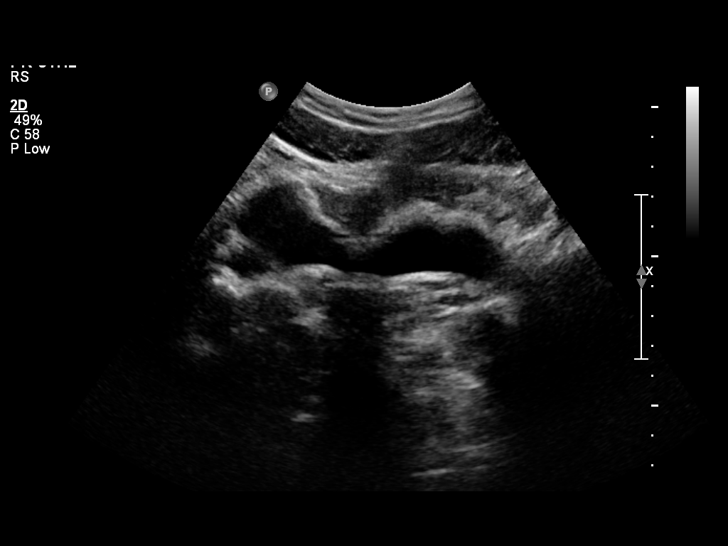
[im 9/18]
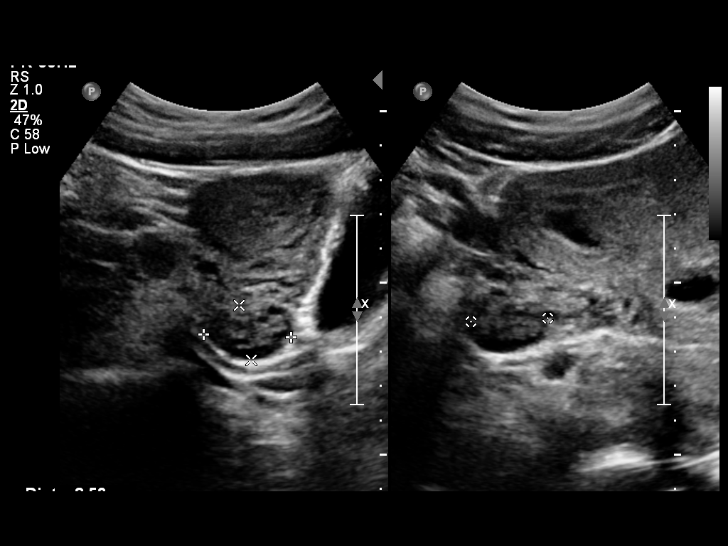
[im 10/18]
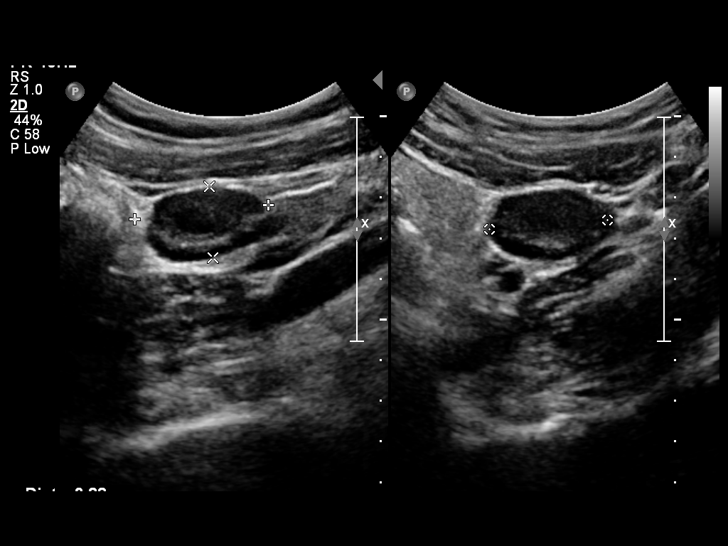
[im 11/18]
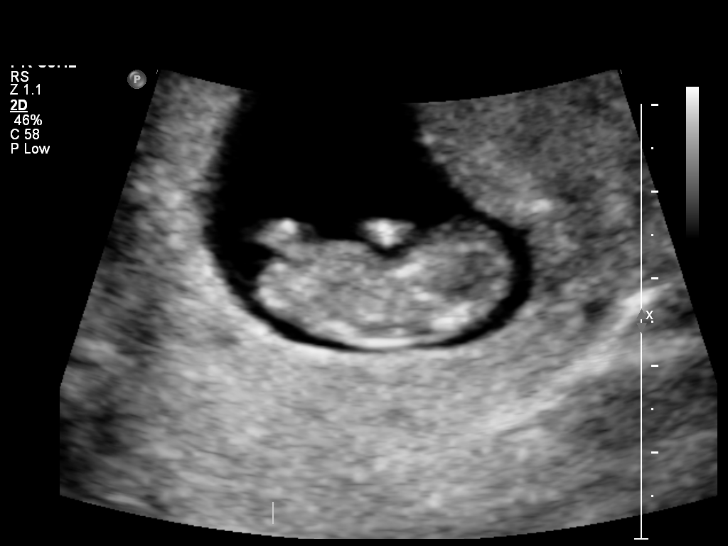
[im 13/18]
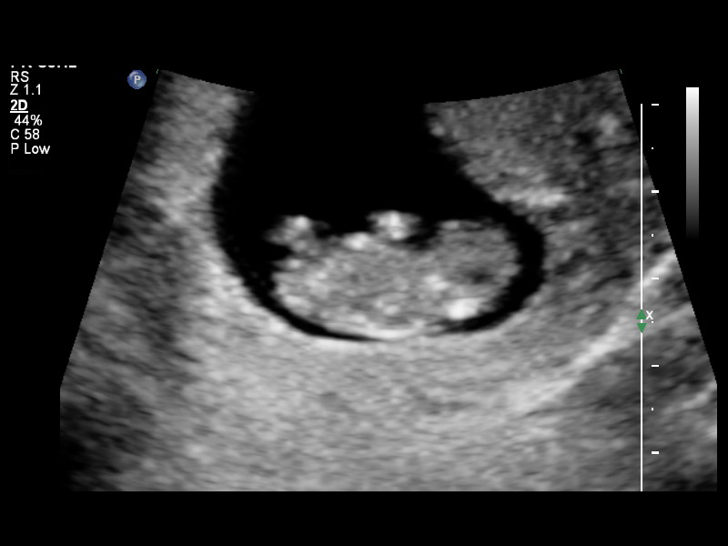
[im 14/18]
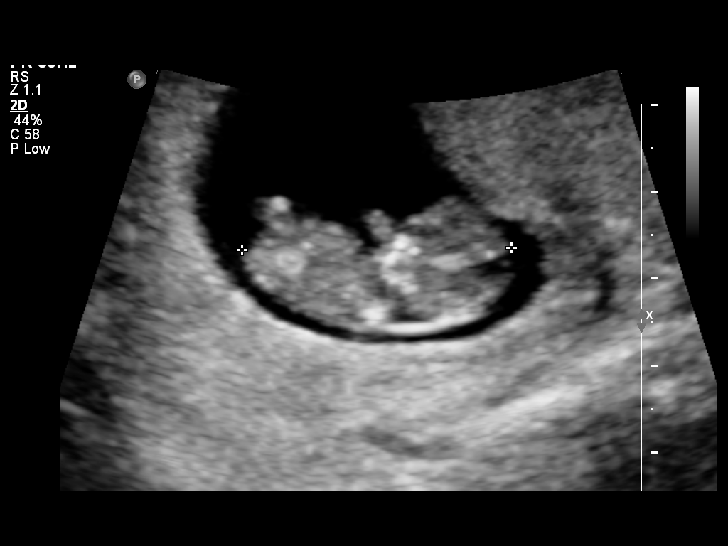
[im 15/18]
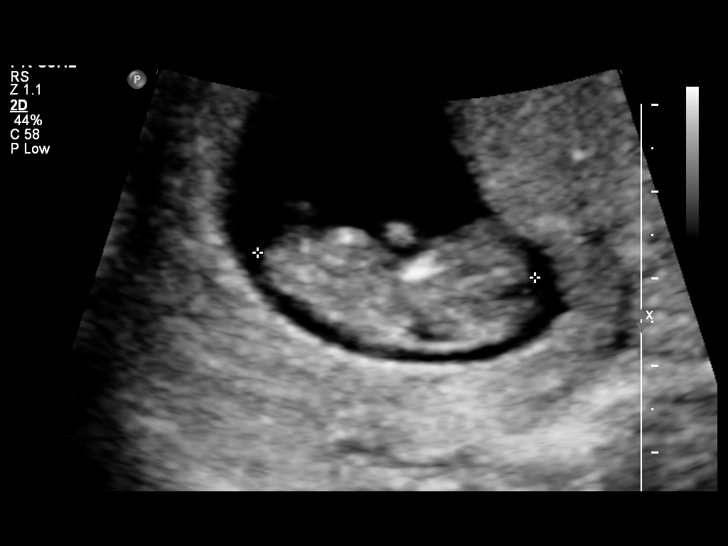
[im 17/18]
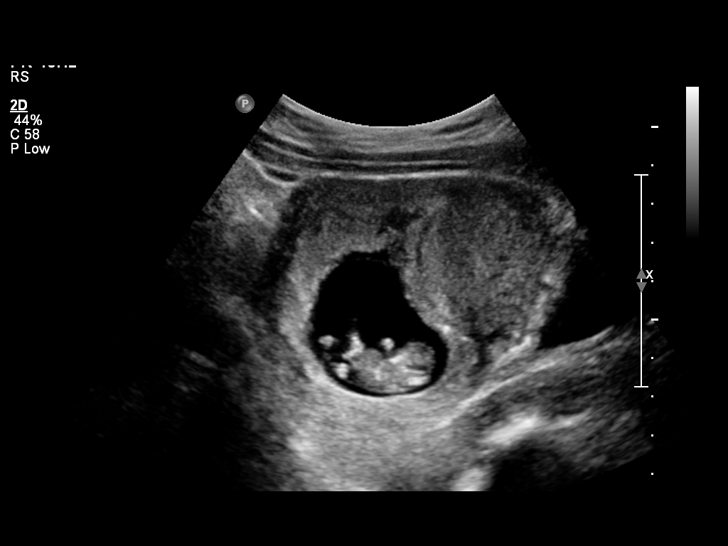
[im 18/18]
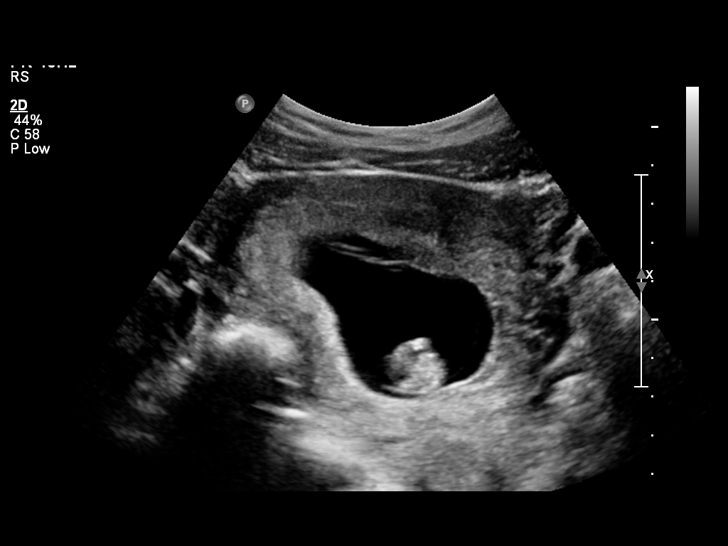

[14 of 18 positions shown; findings below may reference images not displayed]

FINDINGS: Intrauterine gestational sac: Visualized and normal in appearance.

Yolk sac:  Not visualized

Embryo:  Visualized

Cardiac Activity: Visualized

Heart Rate: 174 bpm

CRL:   31.6  mm   10 w 1 d                  US EDC: 04/26/2014

Maternal uterus/adnexae: A small subchorionic hemorrhage is noted.

The ovaries bilaterally are unremarkable.

There is no evidence of free fluid or adnexal mass.
IMPRESSION: Single living intrauterine gestation with estimated gestational age
of 10 weeks 1 day by this ultrasound.

Small subchorionic hemorrhage.

## 2014-10-16 ENCOUNTER — Encounter (HOSPITAL_COMMUNITY): Payer: Self-pay

## 2014-12-28 ENCOUNTER — Encounter (HOSPITAL_COMMUNITY): Payer: Self-pay | Admitting: Obstetrics and Gynecology

## 2015-01-22 ENCOUNTER — Emergency Department (HOSPITAL_COMMUNITY)
Admission: EM | Admit: 2015-01-22 | Discharge: 2015-01-22 | Disposition: A | Discharge status: Home / Self Care | Payer: 59 | Attending: Emergency Medicine | Admitting: Emergency Medicine

## 2015-01-22 ENCOUNTER — Encounter (HOSPITAL_COMMUNITY): Payer: Self-pay | Admitting: Emergency Medicine

## 2015-01-22 DIAGNOSIS — Z79899 Other long term (current) drug therapy: Secondary | ICD-10-CM | POA: Insufficient documentation

## 2015-01-22 DIAGNOSIS — Z87891 Personal history of nicotine dependence: Secondary | ICD-10-CM | POA: Diagnosis not present

## 2015-01-22 DIAGNOSIS — M545 Low back pain, unspecified: Secondary | ICD-10-CM

## 2015-01-22 DIAGNOSIS — Z8742 Personal history of other diseases of the female genital tract: Secondary | ICD-10-CM | POA: Insufficient documentation

## 2015-01-22 DIAGNOSIS — Z8619 Personal history of other infectious and parasitic diseases: Secondary | ICD-10-CM | POA: Insufficient documentation

## 2015-01-22 MED ORDER — METHOCARBAMOL 500 MG PO TABS: 500.0000 mg | ORAL_TABLET | Freq: Two times a day (BID) | ORAL | Status: DC | PRN

## 2015-01-22 MED ORDER — NAPROXEN 500 MG PO TABS: 500.0000 mg | ORAL_TABLET | Freq: Two times a day (BID) | ORAL | Status: DC

## 2015-01-22 MED ORDER — KETOROLAC TROMETHAMINE 60 MG/2ML IM SOLN
30.0000 mg | Freq: Once | INTRAMUSCULAR | Status: AC
  Administered 2015-01-22: 30 mg via INTRAMUSCULAR
  Filled 2015-01-22: qty 2

## 2015-01-22 NOTE — ED Notes (Signed)
Pt c/o low back pain x 2 wks and lt thigh pain x 1 wk.  Denies any injury.

## 2015-01-22 NOTE — ED Provider Notes (Signed)
CSN: [id number]     Arrival date & time 01/22/15  1118 History   First MD Initiated Contact with Patient 01/22/15 1148     Chief Complaint  Patient presents with  . Back Pain  . Leg Pain    Dawn Barnett is a 24 y.o. female who presents emergency department complaining of atraumatic left low back pain for the past 2 weeks. The patient rates her pain at 8 out of 10 and reports this is worse with bending, twisting or movement. 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 Patient describes her pain as an Ache and Tightness. Patient Has Taken BC Powders and Ibuprofen 2 Days Ago for Treatment but Has Attempted No Treatments Today. Patient Is on Nexplanon for Harley-DavidsonBirth Control. Patient Is Not Currently Lactating. The Patient Denies Fevers, Chills, Injury, Dysuria, Urinary Frequency, Urinary Urgency, Hematuria, Abdominal Pain, Nausea, Vomiting, numbness, tingling, or weakness.  (Consider location/radiation/quality/duration/timing/severity/associated sxs/prior Treatment) HPI  Past Medical History  Diagnosis Date  . HPV in female   . Syphilis 2006  . Abnormal Pap smear 01/2009  . BV (bacterial vaginosis) 2010  . H/O cervicitis 2010  . Pelvic pain in female 2010  . Dysplasia 2010  . History of chlamydia 06/05/09  . Itching 09/04/2009  . Weight loss   . H/O gonorrhea 2010  . Irregular periods/menstrual cycles   . H/O varicella    No past surgical history on file. Family History  Problem Relation Age of Onset  . Drug abuse Father   . COPD Maternal Grandmother     Emphysema   History  Substance Use Topics  . Smoking status: Former Smoker -- 0.25 packs/day    Quit date: 03/21/2012  . Smokeless tobacco: Not on file  . Alcohol Use: No   OB History    Gravida Para Term Preterm AB TAB SAB Ectopic Multiple Living   2 2 2       2      Review of Systems  Constitutional: Negative for fever and chills.  Gastrointestinal: Negative for nausea, vomiting, abdominal pain and  diarrhea.  Genitourinary: Negative for dysuria, urgency, frequency, hematuria, vaginal bleeding, vaginal discharge and difficulty urinating.  Musculoskeletal: Positive for back pain. Negative for joint swelling, gait problem, neck pain and neck stiffness.  Skin: Negative for rash.  Neurological: Negative for weakness and numbness.      Allergies  Review of patient's allergies indicates no known allergies.  Home Medications   Prior to Admission medications   Medication Sig Start Date End Date Taking? Authorizing Provider  ferrous sulfate 325 (65 FE) MG tablet Take 1 tablet (325 mg total) by mouth daily with breakfast. 04/23/14   Gerrit HeckJessica Emly, CNM  ibuprofen (ADVIL,MOTRIN) 600 MG tablet Take 1 tablet (600 mg total) by mouth every 6 (six) hours as needed (pain scale < 4). 04/23/14   Gerrit HeckJessica Emly, CNM  methocarbamol (ROBAXIN) 500 MG tablet Take 1 tablet (500 mg total) by mouth 2 (two) times daily as needed for muscle spasms. 01/22/15   Einar GipWilliam Duncan Norlan Rann, PA-C  naproxen (NAPROSYN) 500 MG tablet Take 1 tablet (500 mg total) by mouth 2 (two) times daily with a meal. 01/22/15   Einar GipWilliam Duncan Naviyah Schaffert, PA-C  oxyCODONE-acetaminophen (PERCOCET/ROXICET) 5-325 MG per tablet Take 1-2 tablets by mouth every 4 (four) hours as needed for severe pain (moderate - severe pain). 04/23/14   Gerrit HeckJessica Emly, CNM   BP 122/79 mmHg  Pulse 76  Temp(Src) 98.7 F (37.1 C) (Oral)  Resp 16  SpO2 100%  LMP 11/21/2014 Physical Exam  Constitutional: She appears well-developed and well-nourished. No distress.  HENT:  Head: Normocephalic and atraumatic.  Eyes: Right eye exhibits no discharge. Left eye exhibits no discharge.  Neck: Normal range of motion. Neck supple.  Cardiovascular: Normal rate and regular rhythm.   Pulmonary/Chest: Effort normal. No respiratory distress.  Abdominal: Soft. She exhibits no distension. There is no tenderness.  Musculoskeletal: Normal range of motion. She exhibits no edema.  Patient is  able without difficulty or assistance. No midline back tenderness to palpation. No crepitus, edema, step-offs or deformities noted.  Lymphadenopathy:    She has no cervical adenopathy.  Neurological: She is alert. She has normal reflexes. She displays normal reflexes. Coordination normal.  Sensation is intact in her bilateral lower extremities. Bilateral patellar DTRs are intact.  Skin: Skin is warm and dry. No rash noted. She is not diaphoretic. No erythema. No pallor.  Psychiatric: She has a normal mood and affect. Her behavior is normal.  Nursing note and vitals reviewed.   ED Course  Procedures (including critical care time) Labs Review Labs Reviewed - No data to display  Imaging Review No results found.   EKG Interpretation None      Filed Vitals:   01/22/15 1141  BP: 122/79  Pulse: 76  Temp: 98.7 F (37.1 C)  TempSrc: Oral  Resp: 16  SpO2: 100%     MDM   Meds given in ED:  Medications  ketorolac (TORADOL) injection 30 mg (30 mg Intramuscular Given 01/22/15 1222)    Discharge Medication List as of 01/22/2015 12:00 PM    START taking these medications   Details  methocarbamol (ROBAXIN) 500 MG tablet Take 1 tablet (500 mg total) by mouth 2 (two) times daily as needed for muscle spasms., Starting 01/22/2015, Until Discontinued, Print    naproxen (NAPROSYN) 500 MG tablet Take 1 tablet (500 mg total) by mouth 2 (two) times daily with a meal., Starting 01/22/2015, Until Discontinued, Print        Final diagnoses:  Left-sided low back pain without sciatica   This is a 24 year old female who presents emergency department attending of the left low back pain for the past 2 weeks which is worse with sitting or bending. 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 Patient denies any numbness, tingling, weakness, dysuria, hematuria or abdominal pain. The patient is afebrile and nontoxic-appearing. Patient is able to ambulate in the room without difficulty or  assistance. Patient's bilateral patellar DTRs are intact. Patient with back pain.  No neurological deficits and normal neuro exam.  Patient can walk but states is painful.  No loss of bowel or bladder control.  No concern for cauda equina.  No fever, night sweats, weight loss, h/o cancer, IVDU.  RICE protocol and pain medicine indicated and discussed with patient.  Patient given Toradol IM in the ED. Patient discharged with prescriptions for naproxen and Robaxin. I advised the patient to follow-up with their primary care provider this week. I advised the patient to return to the emergency department with new or worsening symptoms or new concerns. The patient verbalized understanding and agreement with plan.         Lawana Chambers, PA-C 01/22/15 1259  Hilario Quarry, MD 01/22/15 [phone]

## 2015-01-22 NOTE — Discharge Instructions (Signed)
Back Pain, Adult °Low back pain is very common. About 1 in 5 people have back pain. The cause of low back pain is rarely dangerous. The pain often gets better over time. About half of people with a sudden onset of back pain feel better in just 2 weeks. About 8 in 10 people feel better by 6 weeks.  °CAUSES °Some common causes of back pain include: °· Strain of the muscles or ligaments supporting the spine. °· Wear and tear (degeneration) of the spinal discs. °· Arthritis. °· Direct injury to the back. °DIAGNOSIS °Most of the time, the direct cause of low back pain is not known. However, back pain can be treated effectively even when the exact cause of the pain is unknown. Answering your caregiver's questions about your overall health and symptoms is one of the most accurate ways to make sure the cause of your pain is not dangerous. If your caregiver needs more information, he or she may order lab work or imaging tests (X-rays or MRIs). However, even if imaging tests show changes in your back, this usually does not require surgery. °HOME CARE INSTRUCTIONS °For many people, back pain returns. Since low back pain is rarely dangerous, it is often a condition that people can learn to manage on their own.  °· Remain active. It is stressful on the back to sit or stand in one place. Do not sit, drive, or stand in one place for more than 30 minutes at a time. Take short walks on level surfaces as soon as pain allows. Try to increase the length of time you walk each day. °· Do not stay in bed. Resting more than 1 or 2 days can delay your recovery. °· Do not avoid exercise or work. Your body is made to move. It is not dangerous to be active, even though your back may hurt. Your back will likely heal faster if you return to being active before your pain is gone. °· Pay attention to your body when you  bend and lift. Many people have less discomfort when lifting if they bend their knees, keep the load close to their bodies, and  avoid twisting. Often, the most comfortable positions are those that put less stress on your recovering back. °· Find a comfortable position to sleep. Use a firm mattress and lie on your side with your knees slightly bent. If you lie on your back, put a pillow under your knees. °· Only take over-the-counter or prescription medicines as directed by your caregiver. Over-the-counter medicines to reduce pain and inflammation are often the most helpful. Your caregiver may prescribe muscle relaxant drugs. These medicines help dull your pain so you can more quickly return to your normal activities and healthy exercise. °· Put ice on the injured area. °¨ Put ice in a plastic bag. °¨ Place a towel between your skin and the bag. °¨ Leave the ice on for 15-20 minutes, 03-04 times a day for the first 2 to 3 days. After that, ice and heat may be alternated to reduce pain and spasms. °· Ask your caregiver about trying back exercises and gentle massage. This may be of some benefit. °· Avoid feeling anxious or stressed. Stress increases muscle tension and can worsen back pain. It is important to recognize when you are anxious or stressed and learn ways to manage it. Exercise is a great option. °SEEK MEDICAL CARE IF: °· You have pain that is not relieved with rest or medicine. °· You have pain that does not improve in 1 week. °· You have new symptoms. °· You are generally not feeling well. °SEEK   IMMEDIATE MEDICAL CARE IF:  °· You have pain that radiates from your back into your legs. °· You develop new bowel or bladder control problems. °· You have unusual weakness or numbness in your arms or legs. °· You develop nausea or vomiting. °· You develop abdominal pain. °· You feel faint. °Document Released: 12/01/2005 Document Revised: 06/01/2012 Document Reviewed: 04/04/2014 °ExitCare® Patient Information ©2015 [employer]. This information is not intended to replace advice given to you by your health care provider. Make sure you  discuss any questions you have with your health care provider. ° °Back Exercises °Back exercises help treat and prevent back injuries. The goal of back exercises is to increase the strength of your abdominal and back muscles and the flexibility of your back. These exercises should be started when you no longer have back pain. Back exercises include: °· Pelvic Tilt. Lie on your back with your knees bent. Tilt your pelvis until the lower part of your back is against the floor. Hold this position 5 to 10 sec and repeat 5 to 10 times. °· Knee to Chest. Pull first 1 knee up against your chest and hold for 20 to 30 seconds, repeat this with the other knee, and then both knees. This may be done with the other leg straight or bent, whichever feels better. °· Sit-Ups or Curl-Ups. Bend your knees 90 degrees. Start with tilting your pelvis, and do a partial, slow sit-up, lifting your trunk only 30 to 45 degrees off the floor. Take at least 2 to 3 seconds for each sit-up. Do not do sit-ups with your knees out straight. If partial sit-ups are difficult, simply do the above but with only tightening your abdominal muscles and holding it as directed. °· Hip-Lift. Lie on your back with your knees flexed 90 degrees. Push down with your feet and shoulders as you raise your hips a couple inches off the floor; hold for 10 seconds, repeat 5 to 10 times. °· Back arches. Lie on your stomach, propping yourself up on bent elbows. Slowly press on your hands, causing an arch in your low back. Repeat 3 to 5 times. Any initial stiffness and discomfort should lessen with repetition over time. °· Shoulder-Lifts. Lie face down with arms beside your body. Keep hips and torso pressed to floor as you slowly lift your head and shoulders off the floor. °Do not overdo your exercises, especially in the beginning. Exercises may cause you some mild back discomfort which lasts for a few minutes; however, if the pain is more severe, or lasts for more than 15  minutes, do not continue exercises until you see your caregiver. Improvement with exercise therapy for back problems is slow.  °See your caregivers for assistance with developing a proper back exercise program. °Document Released: 01/08/2005 Document Revised: 02/23/2012 Document Reviewed: 10/02/2011 °ExitCare® Patient Information ©2015 [employer]. This information is not intended to replace advice given to you by your health care provider. Make sure you discuss any questions you have with your health care provider. ° °

## 2015-05-24 ENCOUNTER — Encounter (HOSPITAL_COMMUNITY): Payer: Self-pay | Admitting: Obstetrics and Gynecology

## 2015-07-16 ENCOUNTER — Encounter (HOSPITAL_COMMUNITY): Payer: Self-pay | Admitting: Nurse Practitioner

## 2015-07-16 ENCOUNTER — Emergency Department (HOSPITAL_COMMUNITY)
Admission: EM | Admit: 2015-07-16 | Discharge: 2015-07-16 | Disposition: A | Discharge status: Home / Self Care | Payer: 59 | Attending: Emergency Medicine | Admitting: Emergency Medicine

## 2015-07-16 DIAGNOSIS — Z8742 Personal history of other diseases of the female genital tract: Secondary | ICD-10-CM | POA: Insufficient documentation

## 2015-07-16 DIAGNOSIS — Z8619 Personal history of other infectious and parasitic diseases: Secondary | ICD-10-CM | POA: Insufficient documentation

## 2015-07-16 DIAGNOSIS — R51 Headache: Secondary | ICD-10-CM | POA: Insufficient documentation

## 2015-07-16 DIAGNOSIS — Z87891 Personal history of nicotine dependence: Secondary | ICD-10-CM | POA: Diagnosis not present

## 2015-07-16 DIAGNOSIS — Z79899 Other long term (current) drug therapy: Secondary | ICD-10-CM | POA: Diagnosis not present

## 2015-07-16 DIAGNOSIS — K088 Other specified disorders of teeth and supporting structures: Secondary | ICD-10-CM | POA: Insufficient documentation

## 2015-07-16 DIAGNOSIS — H9209 Otalgia, unspecified ear: Secondary | ICD-10-CM | POA: Insufficient documentation

## 2015-07-16 DIAGNOSIS — K0889 Other specified disorders of teeth and supporting structures: Secondary | ICD-10-CM

## 2015-07-16 DIAGNOSIS — Z872 Personal history of diseases of the skin and subcutaneous tissue: Secondary | ICD-10-CM | POA: Insufficient documentation

## 2015-07-16 MED ORDER — PENICILLIN V POTASSIUM 500 MG PO TABS: 500.0000 mg | ORAL_TABLET | Freq: Four times a day (QID) | ORAL | Status: DC

## 2015-07-16 MED ORDER — BUPIVACAINE HCL 0.5 % IJ SOLN
1.8000 mL | Freq: Once | INTRAMUSCULAR | Status: AC
  Administered 2015-07-16: 1.8 mL

## 2015-07-16 MED ORDER — OXYCODONE HCL 5 MG PO TABS: 2.5000 mg | ORAL_TABLET | ORAL | Status: DC | PRN

## 2015-07-16 NOTE — ED Notes (Signed)
Patient states took  ibuprofen this morning at 1000a.

## 2015-07-16 NOTE — Discharge Instructions (Signed)
[facility]  Community Service [facility]  [address]  [place], [place] [address]  Phone [phone]  [facility] [employer] [facility] [place] exemplifies the [employer] vision to improve the health and quality of life of all [place]ians by Public house manager with a passion to care for the underserved and by leading the nation in community-based, service learning oral health education. We are committed to offering comprehensive general dental services for adults, children and special needs patients in a safe, caring and professional setting.  Appointments: Our clinic is open Monday through Friday 8:00 a.m. until 5:00 p.m. The amount of time scheduled for an appointment depends on the patients specific needs. We ask that you keep your appointed time for care or provide 24-hour notice of all appointment changes. Parents or legal guardians must accompany minor children.  Payment for Services: Medicaid and other insurance plans are welcome. Payment for services is due when services are rendered and may be made by cash or credit card. If you have dental insurance, we will assist you with your claim submission.   Emergencies: Emergency services will be provided Monday through Friday on a walk-in basis. Please arrive early for emergency services. After hours emergency services will be provided for patients of record as required.  Services:  Medical illustrator Dentistry  Oral Surgery - Extractions  Root Canals  Sealants and Tooth Colored Fillings  Crowns and Bridges  Dentures and Partial Dentures  Implant Services  Periodontal Services and Cleanings  Cosmetic Building services engineer  3-D/[employer] Imag  You have been diagnosed with Dental pain. Please call the follow up dentist first thing in the morning  on Monday for a follow up appointment. Keep your discharge paperwork from today's visit to bring to the dentist office. You may also use the resource guide listed below to help you find a dentist if you do not already have one to followup with. It is very important that you get evaluated by a dentist as soon as possible.  Use your pain medication as prescribed and do not operate heavy machinery while on pain medication. Note that your pain medication contains acetaminophen (Tylenol) & its is not reccommended that you use additional acetaminophen (Tylenol) while taking this medication. Take your full course of antibiotics. Read the instructions below.  Eat a soft or liquid diet and rinse your mouth out after meals with warm water. You should see a dentist or return here at once if you have increased swelling, increased pain or uncontrolled bleeding from the site of your injury.   SEEK MEDICAL CARE IF:   You have increased pain not controlled with medicines.   You have swelling around your tooth, in your face or neck.   You have bleeding which starts, continues, or gets worse.   You have a fever >101  If you are unable to open your mouth Soft Diet  The soft diet may be recommended after you were put on a full liquid diet. A normal diet may follow. The soft diet can also be used after surgery if you are too ill to keep down a normal diet. The soft diet may also be needed if you have a hard time chewing foods.  DESCRIPTION  Tender foods are used. Foods do not need to be ground or pureed. Most raw fruits and vegetables and coarse breads and cereals  should be avoided. Fried foods and highly seasoned foods may cause discomfort.  NUTRITIONAL ADEQUACY  A healthy diet is possible if foods from each of the basic food groups are eaten daily.  SOFT DIET FOOD LISTS  Milk/Dairy  Allowed: Milk and milk drinks, milk shakes, cream cheese, cottage cheese, mild cheeses.  Avoid: Sharp or highly seasoned  cheese. Meat/Meat Substitutes  Allowed: Broiled, roasted, baked, or stewed tender lean beef, mutton, lamb, veal, chicken, [place], liver, ham, crisp bacon, white fish, tuna, salmon. Eggs, smooth peanut butter.  Avoid: All fried meats, fish, or fowl. Rich gravies and sauces. Lunch meats, sausages, hot dogs. Meats with gristle, chunky peanut butter. Breads/Grains  Allowed: Rice, noodles, spaghetti, macaroni. Dry or cooked refined cereals, such as farina, cream of wheat, oatmeal, grits, whole-wheat cereals. Plain or toasted white or wheat blend or whole-grain breads, soda crackers or saltines, flour tortillas.  Avoid: Wild rice, coarse cereals, such as bran. Seed in or on breads and crackers. Bread or bread products with nuts or seeds. Fruits/Vegetables  Allowed: Fruit and vegetable juices, well-cooked or canned fruits and vegetables, any dried fruit. One citrus fruit daily, 1 vitamin A source daily. Well-ripened, easy to chew fruits, sweet potatoes. Baked, boiled, mashed, creamed, scalloped, or au gratin potatoes. Broths or creamed soups made with allowed vegetables, strained tomatoes.  Avoid: All gas-forming vegetables (corn, radishes, Brussels sprouts, onions, broccoli, cabbage, parsnips, turnips, chili peppers, pinto beans, split peas, dried beans). Fruits containing seeds and skin. Potato chips and corn chips. All others that are not made with allowed vegetables. Highly seasoned soups. Desserts/Sweets  Allowed: Simple desserts, such as custard, junkets, gelatin desserts, plain ice cream and sherbets, simple cakes and cookies, allowed fruits, sugar, syrup, jelly, honey, plain hard candy, and molasses.  Avoid: Rich pastries, any dessert containing dates, nuts, raisins, or coconut. Fried pastries, such as doughnuts. Chocolate. Beverages  Allowed: Fruit and vegetable juices. Caffeine-free carbonated drinks, coffee, and tea.  Avoid: Caffeinated beverages: coffee, tea, soda or pop. Miscellaneous   Allowed: Butter, cream, margarine, mayonnaise, oil. Cream sauces, salt, and mild spices.  Avoid: Highly spiced salad dressings. Highly seasoned foods, hot sauce, mustard, horseradish, and pepper. SAMPLE MENU  Breakfast  Orange juice.  Oatmeal.  Soft cooked egg.  Toast and margarine.  2% milk.  Coffee. Lunch  Meatloaf.  Mashed potato.  Green beans.  Lemon pudding.  Bread and margarine.  Coffee. Dinner  Consomm or apricot nectar.  Chicken breast.  Rice, peas, and carrots.  Applesauce.  Bread and margarine.  2% milk. To cut the amount of fat in your diet, omit margarine and use 1% or skim milk.  NUTRIENT ANALYSIS  Calories........................1953 Kcal.  Protein.........................102 gm.  Carbohydrate...............247 gm.  Fat................................65 gm.  Cholesterol...................449 mg.  Dietary fiber.................19 gm.  Vitamin A.....................2944 RE.  Vitamin C.....................79 mg.  Niacin..........................25 mg.  Riboflavin....................2.0 mg.  Thiamin.......................1.5 mg.  Folate..........................249 mcg.  Calcium.......................1030 mg.  Phosphorus.................1782 mg.  Zinc..............................12 mg.  Iron..............................13 mg.  Sodium.........................299 mg.  Potassium....................3046 mg. Document Released: 03/09/2008 Document Revised: 02/23/2012 Document Reviewed: 03/09/2008  [facility] Patient Information 2014 [place], [place].   RESOURCE GUIDE   Dental Problems  Dr. Grandville Silos $200 dollar visit [address] [place], [place] [address]  [phone]    Patients with Medicaid: [facility] [phone] W. Joellyn Quails.  [address] Phone:  [phone]                                                  Phone:  [phone]  If unable to pay  or uninsured, contact:  [facility] or [facility]. to become qualified for the adult dental clinic.  Chronic Pain Problems Contact [facility]  [phone] Patients need to be referred by their primary care doctor.  Insufficient Money for Medicine Contact [employer] Way:  call "211" or [facility] [phone].  No Primary Care Doctor Call [facility]  [phone] Other agencies that provide inexpensive medical care    [facility]  [phone]    [facility]  [phone]    [facility]  [phone]    [facility]  [phone]    [facility]  [phone]    [facility]  [phone]  Psychological Services [facility] Behavioral Health  [phone] [facility] Services  [phone] [facility] Mental Health   [phone] (emergency services [phone])  Substance Abuse Resources Alcohol and Drug Services  [phone] [facility] [phone] [facility] [place] [phone] Floydene Flock [phone] Residential & Outpatient Substance Abuse Program  [phone]  Abuse/Neglect [facility] Child Abuse Hotline [phone] [facility] Child Abuse Hotline [phone] (After Hours)  Emergency Shelter [facility] Minist[facility] [phone]  Maternity Homes Room at the [place] [facility] [place] [phone] [facility] [phone]  MRSA Hotline #:   641-459-1683    [facility] Resources  [facility]     [employer]                          [facility] Dept. [address] [place]                       [address]      [address] [place] [address]  Blondell Reveal Phone:  [phone]                                   Phone:  [phone]                 Phone:  [phone]  [facility] Mental Health Phone:  [phone]  [facility] Child  Abuse Hotline 9046068660 [phone] (After Hours)

## 2015-07-16 NOTE — ED Notes (Signed)
She c/o R upper dental pain x 2 weeks.

## 2015-07-16 NOTE — ED Provider Notes (Signed)
CSN: [id number]     Arrival date & time 07/16/15  1121 History  This chart was scribed for non-physician practitioner Arthor Captain, PA-C working with Doug Sou, MD by Littie Deeds, ED Scribe. This patient was seen in room TR07C/TR07C and the patient's care was started at 12:24 PM.      Chief Complaint  Patient presents with  . Dental Pain   The history is provided by the patient. No language interpreter was used.    HPI Comments: Dawn Barnett is a 24 y.o. female who presents to the Emergency Department complaining of gradual onset right upper dental pain that started 2-3 weeks ago, but progressively worsened last night. Patient reports having associated right-sided headache, ear pain and jaw pain when opening and closing her mouth. She has not eaten since around 6:00 PM yesterday. Patient denies fever, chills, night sweats, nausea, vomiting, and pain or difficulty with swallowing. She does admit to smoking.   Past Medical History  Diagnosis Date  . HPV in female   . Syphilis 2006  . Abnormal Pap smear 01/2009  . BV (bacterial vaginosis) 2010  . H/O cervicitis 2010  . Pelvic pain in female 2010  . Dysplasia 2010  . History of chlamydia 06/05/09  . Itching 09/04/2009  . Weight loss   . H/O gonorrhea 2010  . Irregular periods/menstrual cycles   . H/O varicella    History reviewed. No pertinent past surgical history. Family History  Problem Relation Age of Onset  . Drug abuse Father   . COPD Maternal Grandmother     Emphysema   History  Substance Use Topics  . Smoking status: Former Smoker -- 0.25 packs/day    Quit date: 03/21/2012  . Smokeless tobacco: Not on file  . Alcohol Use: No   OB History    Gravida Para Term Preterm AB TAB SAB Ectopic Multiple Living   2 2 2       2      Review of Systems  Constitutional: Negative for fever and chills.  HENT: Positive for dental problem and ear pain. Negative for sore throat and trouble swallowing.   Gastrointestinal: Negative  for nausea and vomiting.  Musculoskeletal: Positive for arthralgias.  Neurological: Positive for headaches.      Allergies  Review of patient's allergies indicates no known allergies.  Home Medications   Prior to Admission medications   Medication Sig Start Date End Date Taking? Authorizing Provider  ferrous sulfate 325 (65 FE) MG tablet Take 1 tablet (325 mg total) by mouth daily with breakfast. 04/23/14   Gerrit Heck, CNM  ibuprofen (ADVIL,MOTRIN) 600 MG tablet Take 1 tablet (600 mg total) by mouth every 6 (six) hours as needed (pain scale < 4). 04/23/14   Gerrit Heck, CNM  methocarbamol (ROBAXIN) 500 MG tablet Take 1 tablet (500 mg total) by mouth 2 (two) times daily as needed for muscle spasms. 01/22/15   Everlene Farrier, PA-C  naproxen (NAPROSYN) 500 MG tablet Take 1 tablet (500 mg total) by mouth 2 (two) times daily with a meal. 01/22/15   Everlene Farrier, PA-C  oxyCODONE (OXY IR/ROXICODONE) 5 MG immediate release tablet Take 0.5 tablets (2.5 mg total) by mouth every 4 (four) hours as needed for severe pain. 07/16/15   Arthor Captain, PA-C  oxyCODONE-acetaminophen (PERCOCET/ROXICET) 5-325 MG per tablet Take 1-2 tablets by mouth every 4 (four) hours as needed for severe pain (moderate - severe pain). 04/23/14   Gerrit Heck, CNM  penicillin v potassium (VEETID) 500 MG tablet  Take 1 tablet (500 mg total) by mouth 4 (four) times daily. 07/16/15   Mayukha Symmonds, PA-C   BP 120/71 mmHg  Pulse 65  Temp(Src) 98.3 F (36.8 C) (Oral)  Resp 16  Ht  (1.651 m)  Wt 167 lb (75.751 kg)  BMI 27.79 kg/m2  SpO2 100% Physical Exam  Constitutional: She is oriented to person, place, and time. She appears well-developed and well-nourished. No distress.  HENT:  Head: Normocephalic and atraumatic.  Mouth/Throat: Oropharynx is clear and moist. No oropharyngeal exudate.  Eyes: Pupils are equal, round, and reactive to light.  Neck: Neck supple.  Cardiovascular: Normal rate.   Pulmonary/Chest: Effort  normal.  Musculoskeletal: She exhibits no edema.  Neurological: She is alert and oriented to person, place, and time. No cranial nerve deficit.  Skin: Skin is warm and dry. No rash noted.  Psychiatric: She has a normal mood and affect. Her behavior is normal.  Nursing note and vitals reviewed.   ED Course  Procedures  DIAGNOSTIC STUDIES: Oxygen Saturation is 100% on room air, normal by my interpretation.    COORDINATION OF CARE: 12:26 PM-Discussed treatment plan which includes pain medication and antibiotics with patient/guardian at bedside and patient/guardian agreed to plan.    Labs Review Labs Reviewed - No data to display  Imaging Review No results found.   EKG Interpretation None      MDM   Final diagnoses:  Dentalgia    Patient with toothache.  No gross abscess.  Exam unconcerning for Ludwig's angina or spread of infection.  Will treat with penicillin and pain medicine.  Urged patient to follow-up with dentist.     I personally performed the services described in this documentation, which was scribed in my presence. The recorded information has been reviewed and is accurate.      Arthor Captain, PA-C 07/16/15 1826  Doug Sou, MD 07/17/15 [phone]

## 2015-08-22 ENCOUNTER — Emergency Department (HOSPITAL_COMMUNITY)
Admission: EM | Admit: 2015-08-22 | Discharge: 2015-08-22 | Disposition: A | Discharge status: Home / Self Care | Payer: 59 | Attending: Emergency Medicine | Admitting: Emergency Medicine

## 2015-08-22 ENCOUNTER — Encounter (HOSPITAL_COMMUNITY): Payer: Self-pay | Admitting: Emergency Medicine

## 2015-08-22 DIAGNOSIS — Z8742 Personal history of other diseases of the female genital tract: Secondary | ICD-10-CM | POA: Insufficient documentation

## 2015-08-22 DIAGNOSIS — Z87891 Personal history of nicotine dependence: Secondary | ICD-10-CM | POA: Insufficient documentation

## 2015-08-22 DIAGNOSIS — Z872 Personal history of diseases of the skin and subcutaneous tissue: Secondary | ICD-10-CM | POA: Insufficient documentation

## 2015-08-22 DIAGNOSIS — Z8619 Personal history of other infectious and parasitic diseases: Secondary | ICD-10-CM | POA: Diagnosis not present

## 2015-08-22 DIAGNOSIS — K088 Other specified disorders of teeth and supporting structures: Secondary | ICD-10-CM | POA: Diagnosis present

## 2015-08-22 DIAGNOSIS — K0889 Other specified disorders of teeth and supporting structures: Secondary | ICD-10-CM

## 2015-08-22 DIAGNOSIS — Z79899 Other long term (current) drug therapy: Secondary | ICD-10-CM | POA: Insufficient documentation

## 2015-08-22 DIAGNOSIS — Z791 Long term (current) use of non-steroidal anti-inflammatories (NSAID): Secondary | ICD-10-CM | POA: Insufficient documentation

## 2015-08-22 NOTE — ED Provider Notes (Signed)
CSN: [id number]     Arrival date & time 08/22/15  1123 History  This chart was scribed for non-physician practitioner, Eyvonne Mechanic, PA-C working with Azalia Bilis, MD by Gwenyth Ober, ED scribe. This patient was seen in room TR05C/TR05C and the patient's care was started at 12:31 PM   Chief Complaint  Patient presents with  . Dental Pain   The history is provided by the patient. No language interpreter was used.   HPI Comments: Dawn Barnett is a 24 y.o. female who presents to the Emergency Department complaining of constant, moderate bilateral upper dental pain that started several weeks ago. She states intermittent swelling of her gingiva as an associated symptom. Pt reports that she was seen by a dentist who told her that they would need to surgically remove her upper 3rd molars. They offered her Tylenol. She does not have dental insurance. Pt was seen in the ED on 8/1 for dental pain and received a dental block. Pt has insurance, but no PCP. She was prescribed penicillin and 30 oxycodone-5 mg.   Past Medical History  Diagnosis Date  . HPV in female   . Syphilis 2006  . Abnormal Pap smear 01/2009  . BV (bacterial vaginosis) 2010  . H/O cervicitis 2010  . Pelvic pain in female 2010  . Dysplasia 2010  . History of chlamydia 06/05/09  . Itching 09/04/2009  . Weight loss   . H/O gonorrhea 2010  . Irregular periods/menstrual cycles   . H/O varicella    History reviewed. No pertinent past surgical history. Family History  Problem Relation Age of Onset  . Drug abuse Father   . COPD Maternal Grandmother     Emphysema   Social History  Substance Use Topics  . Smoking status: Former Smoker -- 0.25 packs/day    Quit date: 03/21/2012  . Smokeless tobacco: None  . Alcohol Use: No   OB History    Gravida Para Term Preterm AB TAB SAB Ectopic Multiple Living   Review of Systems  All other systems reviewed and are negative.     Allergies  Review of patient's  allergies indicates no known allergies.  Home Medications   Prior to Admission medications   Medication Sig Start Date End Date Taking? Authorizing Provider  ferrous sulfate 325 (65 FE) MG tablet Take 1 tablet (325 mg total) by mouth daily with breakfast. 04/23/14   Gerrit Heck, CNM  ibuprofen (ADVIL,MOTRIN) 600 MG tablet Take 1 tablet (600 mg total) by mouth every 6 (six) hours as needed (pain scale < 4). 04/23/14   Gerrit Heck, CNM  methocarbamol (ROBAXIN) 500 MG tablet Take 1 tablet (500 mg total) by mouth 2 (two) times daily as needed for muscle spasms. 01/22/15   Everlene Farrier, PA-C  naproxen (NAPROSYN) 500 MG tablet Take 1 tablet (500 mg total) by mouth 2 (two) times daily with a meal. 01/22/15   Everlene Farrier, PA-C  oxyCODONE (OXY IR/ROXICODONE) 5 MG immediate release tablet Take 0.5 tablets (2.5 mg total) by mouth every 4 (four) hours as needed for severe pain. 07/16/15   Arthor Captain, PA-C  oxyCODONE-acetaminophen (PERCOCET/ROXICET) 5-325 MG per tablet Take 1-2 tablets by mouth every 4 (four) hours as needed for severe pain (moderate - severe pain). 04/23/14   Gerrit Heck, CNM  penicillin v potassium (VEETID) 500 MG tablet Take 1 tablet (500 mg total) by mouth 4 (four) times daily. 07/16/15  Abigail Harris, PA-C   BP 121/79 mmHg  Pulse 73  Temp(Src) 98.1 F (36.7 C) (Oral)  Resp 16  SpO2 100% Physical Exam  Constitutional: She appears well-developed and well-nourished. No distress.  HENT:  Head: Normocephalic and atraumatic.  External inspection shows no asymmetry of jaw; no obvious swelling or deformity Neck supple with full, active ROM External palpation of mandible and maxilla non-tender No signs of infection No tenderness along gumline No post-oropharyngeal swelling or erythema Tongue normal Floor of mouth soft and non-tender   Eyes: Conjunctivae and EOM are normal.  Neck: Neck supple. No tracheal deviation present.  Cardiovascular: Normal rate.   Pulmonary/Chest: Effort  normal. No respiratory distress.  Skin: Skin is warm and dry.  Psychiatric: She has a normal mood and affect. Her behavior is normal.  Nursing note and vitals reviewed.   ED Course  Procedures   DIAGNOSTIC STUDIES: Oxygen Saturation is 100% on RA, normal by my interpretation.    COORDINATION OF CARE: 12:36 PM Discussed treatment plan with pt at bedside and pt agreed to plan.   Labs Review Labs Reviewed - No data to display  Imaging Review No results found.   EKG Interpretation None      MDM   Final diagnoses:  Pain, dental   Labs:    Imaging:   Consults:   Therapeutics:   Discharge Meds:   Assessment/Plan: Patient presents with uncomplicated dental pain, no signs of infection. She was given instructions to follow-up with her dentist, ibuprofen or Tylenol as needed for pain.  I personally performed the services described in this documentation, which was scribed in my presence. The recorded information has been reviewed and is accurate.    Eyvonne Mechanic, PA-C 08/23/15 1649  Bethann Berkshire, MD 08/24/15 [phone]

## 2015-08-22 NOTE — ED Notes (Signed)
Per patient, having a bad toothache, R side of face hurting really bad. Pt states she came here last month for the same tooth and was given penicillin and percocet. Pt went to dentist and was told they couldn't pull wisdom teeth at this time, would need an oral surgeon to cut them out. Pt in NAD

## 2015-08-22 NOTE — ED Notes (Signed)
Declined W/C at D/C and was escorted to lobby by RN. 

## 2015-08-22 NOTE — Discharge Instructions (Signed)

## 2015-12-03 ENCOUNTER — Emergency Department (HOSPITAL_COMMUNITY)
Admission: EM | Admit: 2015-12-03 | Discharge: 2015-12-03 | Disposition: A | Discharge status: Home / Self Care | Payer: 59 | Attending: Emergency Medicine | Admitting: Emergency Medicine

## 2015-12-03 ENCOUNTER — Encounter (HOSPITAL_COMMUNITY): Payer: Self-pay | Admitting: Emergency Medicine

## 2015-12-03 DIAGNOSIS — Z8619 Personal history of other infectious and parasitic diseases: Secondary | ICD-10-CM | POA: Diagnosis not present

## 2015-12-03 DIAGNOSIS — Z8742 Personal history of other diseases of the female genital tract: Secondary | ICD-10-CM | POA: Insufficient documentation

## 2015-12-03 DIAGNOSIS — Z87891 Personal history of nicotine dependence: Secondary | ICD-10-CM | POA: Insufficient documentation

## 2015-12-03 DIAGNOSIS — K0889 Other specified disorders of teeth and supporting structures: Secondary | ICD-10-CM

## 2015-12-03 DIAGNOSIS — K029 Dental caries, unspecified: Secondary | ICD-10-CM | POA: Insufficient documentation

## 2015-12-03 DIAGNOSIS — Q899 Congenital malformation, unspecified: Secondary | ICD-10-CM | POA: Insufficient documentation

## 2015-12-03 DIAGNOSIS — Z872 Personal history of diseases of the skin and subcutaneous tissue: Secondary | ICD-10-CM | POA: Insufficient documentation

## 2015-12-03 MED ORDER — HYDROCODONE-ACETAMINOPHEN 5-325 MG PO TABS: 1.0000 | ORAL_TABLET | Freq: Four times a day (QID) | ORAL | Status: DC | PRN

## 2015-12-03 MED ORDER — OXYCODONE-ACETAMINOPHEN 5-325 MG PO TABS
2.0000 | ORAL_TABLET | Freq: Once | ORAL | Status: AC
  Administered 2015-12-03: 2 via ORAL
  Filled 2015-12-03: qty 2

## 2015-12-03 MED ORDER — PENICILLIN V POTASSIUM 500 MG PO TABS: 500.0000 mg | ORAL_TABLET | Freq: Four times a day (QID) | ORAL | Status: AC

## 2015-12-03 NOTE — ED Notes (Signed)
Pt complaint of right facial swelling onset this morning related to growth of wisdom teeth; no insurance or follow up until January.

## 2015-12-03 NOTE — ED Notes (Signed)
Ice pack given

## 2015-12-03 NOTE — Discharge Instructions (Signed)
Keep your schedule follow up appointment with your Dentist for tooth extraction.  Dental Pain    Dental pain may be caused by many things, including:  Tooth decay (cavities or caries). Cavities expose the nerve of your tooth to air and hot or cold temperatures. This can cause pain or discomfort.  Abscess or infection. A dental abscess is a collection of infected pus from a bacterial infection in the inner part of the tooth (pulp). It usually occurs at the end of the tooth's root.  Injury.  An unknown reason (idiopathic). Your pain may be mild or severe. It may only occur when:  You are chewing.  You are exposed to hot or cold temperature.  You are eating or drinking sugary foods or beverages, such as soda or candy. Your pain may also be constant.  HOME CARE INSTRUCTIONS  Watch your dental pain for any changes. The following actions may help to lessen any discomfort that you are feeling:  Take medicines only as directed by your dentist.  If you were prescribed an antibiotic medicine, finish all of it even if you start to feel better.  Keep all follow-up visits as directed by your dentist. This is important.  Do not apply heat to the outside of your face.  Rinse your mouth or gargle with salt water if directed by your dentist. This helps with pain and swelling.  You can make salt water by adding  tsp of salt to 1 cup of warm water. Apply ice to the painful area of your face:  Put ice in a plastic bag.  Place a towel between your skin and the bag.  Leave the ice on for 20 minutes, 2-3 times per day. Avoid foods or drinks that cause you pain, such as:  Very hot or very cold foods or drinks.  Sweet or sugary foods or drinks. SEEK MEDICAL CARE IF:  Your pain is not controlled with medicines.  Your symptoms are worse.  You have new symptoms. SEEK IMMEDIATE MEDICAL CARE IF:  You are unable to open your mouth.  You are having trouble breathing or swallowing.  You have a fever.  Your  face, neck, or jaw is swollen. This information is not intended to replace advice given to you by your health care provider. Make sure you discuss any questions you have with your health care provider.  Document Released: 12/01/2005 Document Revised: 04/17/2015 Document Reviewed: 11/27/2014  Elsevier Interactive Patient Education [employer]2016 [employer] Inc[employer]

## 2015-12-03 NOTE — ED Provider Notes (Signed)
CSN: [id number]     Arrival date & time 12/03/15  1100 History   First MD Initiated Contact with Patient 12/03/15 1236     Chief Complaint  Patient presents with  . Dental Pain  . Oral Swelling   HPI  Dawn Barnett is a 24 year old female presenting with dental pain. She reports intermittent right upper dental pain for the past few months. She has seen a dentist who referred her to oral surgery for tooth extraction. She reports that she has an appointment in January once her dental insurance coverage begins. She reports worsening of dental pain yesterday and she felt that the right side of her face was swollen when she woke this morning. She states that the pain is so severe that the entire right side of her face aches. Denies difficulty handling secretions, swallowing or breathing. She has tried tylenol at home without symptom relief. Chewing on the right side of her mouth exacerbates the pain. Denies fevers, chills, dizziness, syncope, neck pain, SOB, chest pain, nausea or vomiting.   Past Medical History  Diagnosis Date  . HPV in female   . Syphilis 2006  . Abnormal Pap smear 01/2009  . BV (bacterial vaginosis) 2010  . H/O cervicitis 2010  . Pelvic pain in female 2010  . Dysplasia 2010  . History of chlamydia 06/05/09  . Itching 09/04/2009  . Weight loss   . H/O gonorrhea 2010  . Irregular periods/menstrual cycles   . H/O varicella    History reviewed. No pertinent past surgical history. Family History  Problem Relation Age of Onset  . Drug abuse Father   . COPD Maternal Grandmother     Emphysema   Social History  Substance Use Topics  . Smoking status: Former Smoker -- 0.25 packs/day    Quit date: 03/21/2012  . Smokeless tobacco: None  . Alcohol Use: No   OB History    Gravida Para Term Preterm AB TAB SAB Ectopic Multiple Living   Review of Systems  HENT: Positive for dental problem.   All other systems reviewed and are negative.     Allergies   Review of patient's allergies indicates no known allergies.  Home Medications   Prior to Admission medications   Medication Sig Start Date End Date Taking? Authorizing Provider  etonogestrel (NEXPLANON) 68 MG IMPL implant 1 each by Subdermal route once.   Yes Historical Provider, MD  ibuprofen (ADVIL,MOTRIN) 200 MG tablet Take 400 mg by mouth every 6 (six) hours as needed for moderate pain.   Yes Historical Provider, MD  ferrous sulfate 325 (65 FE) MG tablet Take 1 tablet (325 mg total) by mouth daily with breakfast. Patient not taking: Reported on 12/03/2015 04/23/14   Gerrit Heck, CNM  HYDROcodone-acetaminophen (NORCO/VICODIN) 5-325 MG tablet Take 1 tablet by mouth every 6 (six) hours as needed. 12/03/15   Evonte Prestage, PA-C  methocarbamol (ROBAXIN) 500 MG tablet Take 1 tablet (500 mg total) by mouth 2 (two) times daily as needed for muscle spasms. Patient not taking: Reported on 12/03/2015 01/22/15   Everlene Farrier, PA-C  naproxen (NAPROSYN) 500 MG tablet Take 1 tablet (500 mg total) by mouth 2 (two) times daily with a meal. Patient not taking: Reported on 12/03/2015 01/22/15   Everlene Farrier, PA-C  oxyCODONE (OXY IR/ROXICODONE) 5 MG immediate release tablet Take 0.5 tablets (2.5 mg total) by mouth every 4 (four) hours as needed for  severe pain. Patient not taking: Reported on 12/03/2015 07/16/15   Arthor CaptainAbigail Harris, PA-C  oxyCODONE-acetaminophen (PERCOCET/ROXICET) 5-325 MG per tablet Take 1-2 tablets by mouth every 4 (four) hours as needed for severe pain (moderate - severe pain). Patient not taking: Reported on 12/03/2015 04/23/14   Gerrit HeckJessica Emly, CNM  penicillin v potassium (VEETID) 500 MG tablet Take 1 tablet (500 mg total) by mouth 4 (four) times daily. Patient not taking: Reported on 12/03/2015 07/16/15   Arthor CaptainAbigail Harris, PA-C  penicillin v potassium (VEETID) 500 MG tablet Take 1 tablet (500 mg total) by mouth 4 (four) times daily. 12/03/15 12/10/15  Delorese Sellin, PA-C   BP 115/74 mmHg  Pulse  70  Temp(Src) 98.6 F (37 C) (Oral)  Resp 18  Ht 5\' 5"  (1.651 m)  Wt 76.658 kg  BMI 28.12 kg/m2  SpO2 100%  LMP 10/28/2015 Physical Exam  Constitutional: She appears well-developed and well-nourished. No distress.  HENT:  Head: Normocephalic and atraumatic.  Mouth/Throat: Uvula is midline, oropharynx is clear and moist and mucous membranes are normal. Mucous membranes are not dry. No trismus in the jaw. Dental caries present. No dental abscesses or uvula swelling. No posterior oropharyngeal edema.  Poor dentition throughout. TTP over right upper back molars. No erythema of surrounding gumline. No dental abscess. No trismus. No sublingual erythema, induration or tenderness.   Eyes: Conjunctivae are normal. Right eye exhibits no discharge. Left eye exhibits no discharge. No scleral icterus.  Neck: Normal range of motion. Neck supple.  FROM of neck intact. No cervical adenopathy. No swelling of the soft tissues of the neck.   Cardiovascular: Normal rate and regular rhythm.   Pulmonary/Chest: Effort normal and breath sounds normal. No stridor. No respiratory distress.  Musculoskeletal: Normal range of motion.  Lymphadenopathy:    She has no cervical adenopathy.  Neurological: She is alert. Coordination normal.  Skin: Skin is warm and dry.  Psychiatric: She has a normal mood and affect. Her behavior is normal.  Nursing note and vitals reviewed.   ED Course  Procedures (including critical care time) Labs Review Labs Reviewed - No data to display  Imaging Review No results found. I have personally reviewed and evaluated these images and lab results as part of my medical decision-making.   EKG Interpretation None      MDM   Final diagnoses:  Pain, dental   Patient presenting with tooth pain. No obvious abscess on exam. No soft tissue swelling of the cheek or neck. Exam unconcerning for Ludwig's angina or spread of infection.  Pain controlled in ED with percocet. Will discharge  with penicillin and encouraged pt to follow-up with her dentist. Return precautions discussed with pt and given in discharge paperwork. Stable for discharge.      Alveta HeimlichStevi Vinayak Bobier, PA-C 12/03/15 1316  Rolland PorterMark James, MD 12/09/15 [phone]

## 2016-06-24 ENCOUNTER — Emergency Department (HOSPITAL_COMMUNITY)
Admission: EM | Admit: 2016-06-24 | Discharge: 2016-06-24 | Disposition: A | Discharge status: Home / Self Care | Payer: 59 | Attending: Emergency Medicine | Admitting: Emergency Medicine

## 2016-06-24 ENCOUNTER — Encounter (HOSPITAL_COMMUNITY): Payer: Self-pay

## 2016-06-24 DIAGNOSIS — M545 Low back pain, unspecified: Secondary | ICD-10-CM

## 2016-06-24 DIAGNOSIS — Z87891 Personal history of nicotine dependence: Secondary | ICD-10-CM | POA: Insufficient documentation

## 2016-06-24 MED ORDER — NAPROXEN 500 MG PO TABS: 500.0000 mg | ORAL_TABLET | Freq: Two times a day (BID) | ORAL | Status: DC

## 2016-06-24 MED ORDER — CYCLOBENZAPRINE HCL 5 MG PO TABS: 5.0000 mg | ORAL_TABLET | Freq: Two times a day (BID) | ORAL | Status: DC | PRN

## 2016-06-24 MED ORDER — CYCLOBENZAPRINE HCL 10 MG PO TABS
5.0000 mg | ORAL_TABLET | Freq: Once | ORAL | Status: AC
  Administered 2016-06-24: 5 mg via ORAL
  Filled 2016-06-24: qty 1

## 2016-06-24 MED ORDER — KETOROLAC TROMETHAMINE 60 MG/2ML IM SOLN
60.0000 mg | Freq: Once | INTRAMUSCULAR | Status: AC
  Administered 2016-06-24: 60 mg via INTRAMUSCULAR
  Filled 2016-06-24: qty 2

## 2016-06-24 NOTE — ED Provider Notes (Signed)
CSN: [id number]     Arrival date & time 06/24/16  [id number] History  By signing my name below, I, Tanda RockersMargaux Venter, attest that this documentation has been prepared under the direction and in the presence of Cheri FowlerKayla Sabian Kuba, PA-C. Electronically Signed: Tanda RockersMargaux Venter, ED Scribe. 06/24/2016. 10:28 AM.   No chief complaint on file.  The history is provided by the patient. No language interpreter was used.    HPI Comments: Dawn Barnett is a 25 y.o. female who presents to the Emergency Department complaining of sudden onset, constant, sharp, lower back pain radiating to mid back that began this morning. Pt reports that she was bending over to put on her child's shoes when she began having immediate pain. The pain did radiate down her BLEs but pt states this has since resolved. The pain is exacerbated with movement. She did not take any pain medication PTA. She mentions having similar pain in the past. No IVDA or risk of pregnancy. Pt has nexplanon implant. Denies weakness, numbness, urinary or bowel incontinence, abdominal pain, vaginal complaints, dysuria, hematuria, or any other associated symptoms.   Past Medical History  Diagnosis Date  . HPV in female   . Syphilis 2006  . Abnormal Pap smear 01/2009  . BV (bacterial vaginosis) 2010  . H/O cervicitis 2010  . Pelvic pain in female 2010  . Dysplasia 2010  . History of chlamydia 06/05/09  . Itching 09/04/2009  . Weight loss   . H/O gonorrhea 2010  . Irregular periods/menstrual cycles   . H/O varicella    History reviewed. No pertinent past surgical history. Family History  Problem Relation Age of Onset  . Drug abuse Father   . COPD Maternal Grandmother     Emphysema   Social History  Substance Use Topics  . Smoking status: Former Smoker -- 0.25 packs/day    Quit date: 03/21/2012  . Smokeless tobacco: None  . Alcohol Use: No   OB History    Gravida Para Term Preterm AB TAB SAB Ectopic Multiple Living   2 2 2       2      Review of Systems   Gastrointestinal: Negative for abdominal pain.       Negative for bowel incontinence  Genitourinary: Negative for dysuria, hematuria, vaginal bleeding and vaginal discharge.       Negative for urinary incontinence  Musculoskeletal: Positive for back pain.  Neurological: Negative for weakness and numbness.   Allergies  Review of patient's allergies indicates no known allergies.  Home Medications   Prior to Admission medications   Medication Sig Start Date End Date Taking? Authorizing Provider  etonogestrel (NEXPLANON) 68 MG IMPL implant 1 each by Subdermal route once.    Historical Provider, MD  ferrous sulfate 325 (65 FE) MG tablet Take 1 tablet (325 mg total) by mouth daily with breakfast. Patient not taking: Reported on 12/03/2015 04/23/14   Gerrit HeckJessica Emly, CNM  HYDROcodone-acetaminophen (NORCO/VICODIN) 5-325 MG tablet Take 1 tablet by mouth every 6 (six) hours as needed. 12/03/15   Stevi Barrett, PA-C  ibuprofen (ADVIL,MOTRIN) 200 MG tablet Take 400 mg by mouth every 6 (six) hours as needed for moderate pain.    Historical Provider, MD  methocarbamol (ROBAXIN) 500 MG tablet Take 1 tablet (500 mg total) by mouth 2 (two) times daily as needed for muscle spasms. Patient not taking: Reported on 12/03/2015 01/22/15   Everlene FarrierWilliam Dansie, PA-C  naproxen (NAPROSYN) 500 MG tablet Take 1 tablet (500 mg total) by mouth 2 (two)  times daily with a meal. Patient not taking: Reported on 12/03/2015 01/22/15   Everlene Farrier, PA-C  oxyCODONE (OXY IR/ROXICODONE) 5 MG immediate release tablet Take 0.5 tablets (2.5 mg total) by mouth every 4 (four) hours as needed for severe pain. Patient not taking: Reported on 12/03/2015 07/16/15   Arthor Captain, PA-C  oxyCODONE-acetaminophen (PERCOCET/ROXICET) 5-325 MG per tablet Take 1-2 tablets by mouth every 4 (four) hours as needed for severe pain (moderate - severe pain). Patient not taking: Reported on 12/03/2015 04/23/14   Gerrit Heck, CNM  penicillin v potassium (VEETID)  500 MG tablet Take 1 tablet (500 mg total) by mouth 4 (four) times daily. Patient not taking: Reported on 12/03/2015 07/16/15   Arthor Captain, PA-C   BP 121/84 mmHg  Pulse 90  Temp(Src) 98.2 F (36.8 C) (Oral)  Resp 20  SpO2 100%   Physical Exam  Constitutional: She is oriented to person, place, and time. She appears well-developed and well-nourished.  HENT:  Head: Atraumatic.  Eyes: Conjunctivae are normal.  Cardiovascular: Normal rate, regular rhythm, normal heart sounds and intact distal pulses.   Pulses:      Dorsalis pedis pulses are 2+ on the right side, and 2+ on the left side.  Pulmonary/Chest: Effort normal and breath sounds normal.  Abdominal: Soft. Bowel sounds are normal. She exhibits no distension. There is no tenderness.  Musculoskeletal: She exhibits tenderness.       Back:  No spinous process tenderness.  No step offs. No crepitus.  Diffuse lumbar tenderness with spasms.  Neurological: She is alert and oriented to person, place, and time.  Reflex Scores:      Patellar reflexes are 2+ on the right side and 2+ on the left side.      Achilles reflexes are 2+ on the right side and 2+ on the left side. No saddle anesthesia. Strength and sensation intact bilaterally throughout lower extremities.  Gait slowed, but normal.   Skin: Skin is warm and dry.  Psychiatric: She has a normal mood and affect. Her behavior is normal.    ED Course  Procedures (including critical care time)  DIAGNOSTIC STUDIES: Oxygen Saturation is 100% on RA, normal by my interpretation.    COORDINATION OF CARE: 10:26 AM-Discussed treatment plan which includes Toradol injection and muscle relaxers with pt at bedside and pt agreed to plan.   Labs Review Labs Reviewed  POC URINE PREG, ED    Imaging Review No results found. I have personally reviewed and evaluated these images and lab results as part of my medical decision-making.   EKG Interpretation None      MDM   Final diagnoses:   Bilateral low back pain without sciatica   Patient with back pain.  No neurological deficits and normal neuro exam.  Patient is ambulatory.  No loss of bowel or bladder control.  No concern for cauda equina.  No fever, night sweats, weight loss, h/o cancer, IVDA, no recent procedure to back. No urinary symptoms suggestive of UTI.  Treated with Toradol and Flexeril in ED.  Home with Naproxen and Flexeril.  Supportive care and return precaution discussed. Appears safe for discharge at this time. Follow up as indicated in discharge paperwork.   I personally performed the services described in this documentation, which was scribed in my presence. The recorded information has been reviewed and is accurate.      Cheri Fowler, PA-C 06/24/16 1156  Cathren Laine, MD 06/24/16 1240

## 2016-06-24 NOTE — ED Notes (Signed)
C/o LBP this am after bending over to tie shoes. Denies numbness or tingling.

## 2016-06-24 NOTE — ED Notes (Signed)
patient here with lower back pain since am. States started when she bent over to put on childs shoes, no neuro deficit

## 2016-06-24 NOTE — Discharge Instructions (Signed)

## 2016-07-03 ENCOUNTER — Other Ambulatory Visit: Payer: Self-pay | Admitting: Chiropractic Medicine

## 2016-07-03 ENCOUNTER — Ambulatory Visit
Admission: RE | Admit: 2016-07-03 | Discharge: 2016-07-03 | Disposition: A | Discharge status: Home / Self Care | Payer: 59 | Source: Ambulatory Visit | Attending: Chiropractic Medicine | Admitting: Chiropractic Medicine

## 2016-07-03 DIAGNOSIS — M549 Dorsalgia, unspecified: Secondary | ICD-10-CM

## 2016-11-14 ENCOUNTER — Encounter (HOSPITAL_COMMUNITY): Payer: Self-pay | Admitting: Emergency Medicine

## 2016-11-14 ENCOUNTER — Emergency Department (HOSPITAL_COMMUNITY)
Admission: EM | Admit: 2016-11-14 | Discharge: 2016-11-14 | Disposition: A | Discharge status: Home / Self Care | Payer: 59 | Attending: Physician Assistant | Admitting: Physician Assistant

## 2016-11-14 DIAGNOSIS — N898 Other specified noninflammatory disorders of vagina: Secondary | ICD-10-CM | POA: Insufficient documentation

## 2016-11-14 DIAGNOSIS — Z87891 Personal history of nicotine dependence: Secondary | ICD-10-CM | POA: Insufficient documentation

## 2016-11-14 LAB — WET PREP, GENITAL
Clue Cells Wet Prep HPF POC: NONE SEEN
Sperm: NONE SEEN
Trich, Wet Prep: NONE SEEN
Yeast Wet Prep HPF POC: NONE SEEN

## 2016-11-14 MED ORDER — ONDANSETRON 8 MG PO TBDP
8.0000 mg | ORAL_TABLET | Freq: Once | ORAL | Status: AC
  Administered 2016-11-14: 8 mg via ORAL
  Filled 2016-11-14: qty 1

## 2016-11-14 NOTE — ED Provider Notes (Signed)
[facility]-EMERGENCY DEPT Provider Note   CSN: [id number] Arrival date & time: 11/14/16  1126     History   Chief Complaint Chief Complaint  Patient presents with  . Vaginal Itching    HPI Dawn Barnett is a 25 y.o. female who presents with vaginal itching. PMH significant for hx of BV, cervicitis, chlamydia, hpv, syphilis, cervical dysplasia. She states she has issues with recurrent BV. She went to the health dept 2 weeks ago and was treated for BV and given a rx for Diflucan. She took Metronidazole and then started to have vaginal itching. She took the Diflucan without relief of her symptoms. She also had STD testing which was negative. They gave her suppressive therapy for BV to start which she has not started yet. She reports using a new soap - "Dove sensitive" which she started 1 month ago. She has not has sexual intercourse since October. She states she is sexually active with the same partner. She uses condoms inconsistently. She states she is still having vaginal discharge. Denies fever, chills, abdominal pain, N/V, dysuria. No vaginal bleeding.   HPI  Past Medical History:  Diagnosis Date  . Abnormal Pap smear 01/2009  . BV (bacterial vaginosis) 2010  . Dysplasia 2010  . H/O cervicitis 2010  . H/O gonorrhea 2010  . H/O varicella   . History of chlamydia 06/05/09  . HPV in female   . Irregular periods/menstrual cycles   . Itching 09/04/2009  . Pelvic pain in female 2010  . Syphilis 2006  . Weight loss     Patient Active Problem List   Diagnosis Date Noted  . Vaginal delivery 04/23/2014  . Gonorrhea complicating pregnancy 09/12/2013  . Smoker 09/12/2013  . Hyperemesis arising during pregnancy 09/12/2013  . Hx of syphilis 09/12/2013  . 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 Hx of chlamydia infection 09/12/2013    History reviewed. No pertinent surgical history.  OB History    Gravida Para Term Preterm AB Living   2 2 2     2    SAB TAB Ectopic Multiple Live Births           2       Home Medications    Prior to Admission medications   Medication Sig Start Date End Date Taking? Authorizing Provider  cyclobenzaprine (FLEXERIL) 10 MG tablet Take 10 mg by mouth 2 (two) times daily as needed. 09/03/16   Historical Provider, MD  etonogestrel (NEXPLANON) 68 MG IMPL implant 1 each by Subdermal route once.    Historical Provider, MD  ferrous sulfate 325 (65 FE) MG tablet Take 1 tablet (325 mg total) by mouth daily with breakfast. Patient not taking: Reported on 12/03/2015 04/23/14   Gerrit HeckJessica Emly, CNM  HYDROcodone-acetaminophen (NORCO/VICODIN) 5-325 MG tablet Take 1 tablet by mouth every 6 (six) hours as needed. 12/03/15   Stevi Barrett, PA-C  ibuprofen (ADVIL,MOTRIN) 200 MG tablet Take 400 mg by mouth every 6 (six) hours as needed for moderate pain.    Historical Provider, MD  methocarbamol (ROBAXIN) 500 MG tablet Take 1 tablet (500 mg total) by mouth 2 (two) times daily as needed for muscle spasms. Patient not taking: Reported on 12/03/2015 01/22/15   Everlene FarrierWilliam Dansie, PA-C  naproxen (NAPROSYN) 500 MG tablet Take 1 tablet (500 mg total) by mouth 2 (two) times daily. 06/24/16   Cheri FowlerKayla Rose, PA-C  oxyCODONE (OXY IR/ROXICODONE) 5 MG immediate release tablet Take 0.5 tablets (2.5 mg total) by mouth every 4 (four) hours as needed for severe  pain. Patient not taking: Reported on 12/03/2015 07/16/15   Arthor CaptainAbigail Harris, PA-C  oxyCODONE-acetaminophen (PERCOCET/ROXICET) 5-325 MG per tablet Take 1-2 tablets by mouth every 4 (four) hours as needed for severe pain (moderate - severe pain). Patient not taking: Reported on 12/03/2015 04/23/14   Gerrit HeckJessica Emly, CNM  penicillin v potassium (VEETID) 500 MG tablet Take 1 tablet (500 mg total) by mouth 4 (four) times daily. Patient not taking: Reported on 12/03/2015 07/16/15   Arthor CaptainAbigail Harris, PA-C  valACYclovir (VALTREX) 500 MG tablet Take 500 mg by mouth daily. 08/19/16   Historical Provider, MD    Family History Family History  Problem Relation Age of Onset   . Drug abuse Father   . COPD Maternal Grandmother     Emphysema    Social History Social History  Substance Use Topics  . Smoking status: Former Smoker    Packs/day: 0.25    Quit date: 03/21/2012  . Smokeless tobacco: Never Used  . Alcohol use No     Allergies   Patient has no known allergies.   Review of Systems Review of Systems  Constitutional: Negative for chills and fever.  Gastrointestinal: Negative for abdominal pain, nausea and vomiting.  Genitourinary: Positive for vaginal discharge and vaginal pain (itching). Negative for dysuria, flank pain, frequency, pelvic pain and vaginal bleeding.  All other systems reviewed and are negative.    Physical Exam Updated Vital Signs BP 136/86 (BP Location: Left Arm)   Pulse 89   Temp 98.7 F (37.1 C) (Oral)   Resp 18   Ht 5\' 5"  (1.651 m)   Wt 93 kg   SpO2 99%   BMI 34.11 kg/m   Physical Exam  Constitutional: She is oriented to person, place, and time. She appears well-developed and well-nourished. No distress.  HENT:  Head: Normocephalic and atraumatic.  Eyes: Conjunctivae are normal. Pupils are equal, round, and reactive to light. Right eye exhibits no discharge. Left eye exhibits no discharge. No scleral icterus.  Neck: Normal range of motion.  Cardiovascular: Normal rate.   Pulmonary/Chest: Effort normal. No respiratory distress.  Abdominal: Soft. Bowel sounds are normal. She exhibits no distension and no mass. There is no tenderness. There is no rebound and no guarding. No hernia.  Mild, generalized tenderness  Genitourinary:  Genitourinary Comments: No inguinal lymphadenopathy or inguinal hernia noted. Normal external genitalia. No pain with speculum insertion. Closed cervical os with normal appearance - no rash or lesions. Moderate amount of whitish clumpy discharge noted. No bleeding noted from cervix or in vaginal vault. On bimanual examination no adnexal tenderness or cervical motion tenderness. Chaperone  present during exam.    Neurological: She is alert and oriented to person, place, and time.  Skin: Skin is warm and dry.  Psychiatric: She has a normal mood and affect. Her behavior is normal.  Nursing note and vitals reviewed.    ED Treatments / Results  Labs (all labs ordered are listed, but only abnormal results are displayed) Labs Reviewed  WET PREP, GENITAL - Abnormal; Notable for the following:       Result Value   WBC, Wet Prep HPF POC MANY (*)    All other components within normal limits    EKG  EKG Interpretation None       Radiology No results found.  Procedures Procedures (including critical care time)  Medications Ordered in ED Medications  ondansetron (ZOFRAN-ODT) disintegrating tablet 8 mg (8 mg Oral Given 11/14/16 1237)     Initial Impression / Assessment  and Plan / ED Course  I have reviewed the triage vital signs and the nursing notes.  Pertinent labs & imaging results that were available during my care of the patient were reviewed by me and considered in my medical decision making (see chart for details).  Clinical Course    25 year old female with vaginal itching. Unclear etiology. Pelvic exam remarkable for small-moderate amount of discharge however wet prep shows many WBC without clue cells or yeast. Patient refused STD testing since she had it done 2 weeks ago and was normal. Advised to stop using all soaps in the area, avoid tight clothing, douching, etc. Follow up with health dept.  Of note, patient reported nausea after initial exam. Zofran given with good relief. Patient is NAD, non-toxic, with stable VS. Patient is informed of clinical course, understands medical decision making process, and agrees with plan. Opportunity for questions provided and all questions answered. Return precautions given.   Final Clinical Impressions(s) / ED Diagnoses   Final diagnoses:  Vaginal itching    New Prescriptions Current Discharge Medication List        Bethel Born, PA-C 11/14/16 1327    Courteney Lyn Mackuen, MD 11/14/16 1414

## 2016-11-14 NOTE — ED Triage Notes (Signed)
Patient states she has itching in her vagina area.  Denies any redness or pain.

## 2016-12-29 ENCOUNTER — Encounter (HOSPITAL_COMMUNITY): Payer: Self-pay | Admitting: Emergency Medicine

## 2016-12-29 ENCOUNTER — Emergency Department (HOSPITAL_COMMUNITY)
Admission: EM | Admit: 2016-12-29 | Discharge: 2016-12-30 | Disposition: A | Discharge status: Home / Self Care | Payer: 59 | Attending: Emergency Medicine | Admitting: Emergency Medicine

## 2016-12-29 ENCOUNTER — Emergency Department (HOSPITAL_COMMUNITY): Payer: 59

## 2016-12-29 DIAGNOSIS — Z87891 Personal history of nicotine dependence: Secondary | ICD-10-CM | POA: Insufficient documentation

## 2016-12-29 DIAGNOSIS — J111 Influenza due to unidentified influenza virus with other respiratory manifestations: Secondary | ICD-10-CM | POA: Diagnosis not present

## 2016-12-29 DIAGNOSIS — R05 Cough: Secondary | ICD-10-CM | POA: Diagnosis present

## 2016-12-29 DIAGNOSIS — R69 Illness, unspecified: Secondary | ICD-10-CM

## 2016-12-29 LAB — COMPREHENSIVE METABOLIC PANEL
ALBUMIN: 4.4 g/dL (ref 3.5–5.0)
ALT: 28 U/L (ref 14–54)
ANION GAP: 8 (ref 5–15)
AST: 24 U/L (ref 15–41)
Alkaline Phosphatase: 60 U/L (ref 38–126)
BILIRUBIN TOTAL: 0.3 mg/dL (ref 0.3–1.2)
BUN: 10 mg/dL (ref 6–20)
CO2: 21 mmol/L — ABNORMAL LOW (ref 22–32)
Calcium: 8.9 mg/dL (ref 8.9–10.3)
Chloride: 107 mmol/L (ref 101–111)
Creatinine, Ser: 0.79 mg/dL (ref 0.44–1.00)
GFR calc Af Amer: >60 mL/min (ref >60)
GFR calc non Af Amer: >60 mL/min (ref >60)
GLUCOSE: 113 mg/dL — AB (ref 65–99)
POTASSIUM: 3.7 mmol/L (ref 3.5–5.1)
Sodium: 136 mmol/L (ref 135–145)
TOTAL PROTEIN: 7.7 g/dL (ref 6.5–8.1)

## 2016-12-29 LAB — URINALYSIS, ROUTINE W REFLEX MICROSCOPIC
BILIRUBIN URINE: NEGATIVE
Glucose, UA: NEGATIVE mg/dL
Hgb urine dipstick: NEGATIVE
KETONES UR: NEGATIVE mg/dL
Nitrite: NEGATIVE
Protein, ur: NEGATIVE mg/dL
Specific Gravity, Urine: 1.032 — ABNORMAL HIGH (ref 1.005–1.030)
pH: 5 (ref 5.0–8.0)

## 2016-12-29 LAB — CBC WITH DIFFERENTIAL/PLATELET
BASOS ABS: 0 10*3/uL (ref 0.0–0.1)
Basophils Relative: 0 %
Eosinophils Absolute: 0.1 10*3/uL (ref 0.0–0.7)
Eosinophils Relative: 1 %
HEMATOCRIT: 38.6 % (ref 36.0–46.0)
HEMOGLOBIN: 13.2 g/dL (ref 12.0–15.0)
LYMPHS PCT: 13 %
Lymphs Abs: 1 10*3/uL (ref 0.7–4.0)
MCH: 30.5 pg (ref 26.0–34.0)
MCHC: 34.2 g/dL (ref 30.0–36.0)
MCV: 89.1 fL (ref 78.0–100.0)
MONO ABS: 0.5 10*3/uL (ref 0.1–1.0)
Monocytes Relative: 7 %
NEUTROS ABS: 5.7 10*3/uL (ref 1.7–7.7)
Neutrophils Relative %: 79 %
Platelets: 221 10*3/uL (ref 150–400)
RBC: 4.33 MIL/uL (ref 3.87–5.11)
RDW: 12.5 % (ref 11.5–15.5)
WBC: 7.2 10*3/uL (ref 4.0–10.5)

## 2016-12-29 LAB — PREGNANCY, URINE: PREG TEST UR: NEGATIVE

## 2016-12-29 MED ORDER — OSELTAMIVIR PHOSPHATE 75 MG PO CAPS: 75.0000 mg | ORAL_CAPSULE | Freq: Two times a day (BID) | ORAL | 0 refills | Status: DC

## 2016-12-29 MED ORDER — OSELTAMIVIR PHOSPHATE 75 MG PO CAPS
75.0000 mg | ORAL_CAPSULE | Freq: Once | ORAL | Status: AC
  Administered 2016-12-29: 75 mg via ORAL
  Filled 2016-12-29: qty 1

## 2016-12-29 MED ORDER — ONDANSETRON 4 MG PO TBDP: 4.0000 mg | ORAL_TABLET | Freq: Three times a day (TID) | ORAL | 0 refills | Status: DC | PRN

## 2016-12-29 MED ORDER — SODIUM CHLORIDE 0.9 % IV BOLUS (SEPSIS)
2000.0000 mL | Freq: Once | INTRAVENOUS | Status: AC
  Administered 2016-12-29: 2000 mL via INTRAVENOUS

## 2016-12-29 MED ORDER — ONDANSETRON 4 MG PO TBDP
4.0000 mg | ORAL_TABLET | Freq: Once | ORAL | Status: AC
  Administered 2016-12-29: 4 mg via ORAL
  Filled 2016-12-29: qty 1

## 2016-12-29 MED ORDER — KETOROLAC TROMETHAMINE 30 MG/ML IJ SOLN
30.0000 mg | Freq: Once | INTRAMUSCULAR | Status: AC
  Administered 2016-12-29: 30 mg via INTRAVENOUS
  Filled 2016-12-29: qty 1

## 2016-12-29 NOTE — ED Provider Notes (Signed)
[facility] DEPT Provider Note   CSN: [id number] Arrival date & time: 12/29/16  1837  By signing my name below, I, Dawn JanskyAlbert Barnett, attest that this documentation has been prepared under the direction and in the presence of non-physician practitioner, Dawn EmeryNicole Criston Chancellor, PA. Electronically Signed: Modena JanskyAlbert Barnett, Scribe. 12/29/2016. 8:25 PM.  History   Chief Complaint Chief Complaint  Patient presents with  . Flu Like Symptoms    The history is provided by the patient. No language interpreter was used.    HPI Comments: Dawn Barnett is a 26 y.o. female who presents to the Emergency Department complaining of intermittent moderate cough that started today. She states she had a sudden onset of gradually worsening URI-like symptoms. Per nurse's note, she took tylenol 4.5 hours ago with no relief. She describes the cough as productive. She reports associated symptoms of chills, subjective fever, generalized myalgias, nausea, and vomiting (5 episodes), Dysuria, hematuria, urinary frequency, abnormal vaginal discharge.. Pt's temperature in the ED today was 100.4. She denies any flu vaccine this year. Denies any tobacco use  Past Medical History:  Diagnosis Date  . Abnormal Pap smear 01/2009  . BV (bacterial vaginosis) 2010  . Dysplasia 2010  . H/O cervicitis 2010  . H/O gonorrhea 2010  . H/O varicella   . History of chlamydia 06/05/09  . HPV in female   . Irregular periods/menstrual cycles   . Itching 09/04/2009  . Pelvic pain in female 2010  . Syphilis 2006  . Weight loss     Patient Active Problem List   Diagnosis Date Noted  . Vaginal delivery 04/23/2014  . Gonorrhea complicating pregnancy 09/12/2013  . Smoker 09/12/2013  . Hyperemesis arising during pregnancy 09/12/2013  . Hx of syphilis 09/12/2013  . 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 Hx of chlamydia infection 09/12/2013    History reviewed. No pertinent surgical history.  OB History    Gravida Para Term Preterm AB Living   2 2 2      2    SAB TAB Ectopic Multiple Live Births           2       Home Medications    Prior to Admission medications   Medication Sig Start Date End Date Taking? Authorizing Provider  acetaminophen (TYLENOL) 500 MG tablet Take 1,000 mg by mouth every 6 (six) hours as needed.   Yes Historical Provider, MD  etonogestrel (NEXPLANON) 68 MG IMPL implant 1 each by Subdermal route once.   Yes Historical Provider, MD  ondansetron (ZOFRAN ODT) 4 MG disintegrating tablet Take 1 tablet (4 mg total) by mouth every 8 (eight) hours as needed for nausea or vomiting. 12/29/16   Tighe Gitto, PA-C  oseltamivir (TAMIFLU) 75 MG capsule Take 1 capsule (75 mg total) by mouth every 12 (twelve) hours. 12/29/16   Dawn ReiningNicole Arriana Lohmann, PA-C    Family History Family History  Problem Relation Age of Onset  . Drug abuse Father   . COPD Maternal Grandmother     Emphysema    Social History Social History  Substance Use Topics  . Smoking status: Former Smoker    Packs/day: 0.25    Quit date: 03/21/2012  . Smokeless tobacco: Never Used  . Alcohol use No     Allergies   Patient has no known allergies.   Review of Systems Review of Systems  10 systems reviewed and found to be negative, except as noted in the HPI.   Physical Exam Updated Vital Signs BP 141/80 (BP Location: Left Arm)  Pulse (!) 125   Temp 100.4 F (38 C) (Oral)   Resp 18   SpO2 99%   Physical Exam  Constitutional: She is oriented to person, place, and time. She appears well-developed and well-nourished. No distress.  HENT:  Head: Normocephalic.  Right Ear: External ear normal.  Left Ear: External ear normal.  Mouth/Throat: Oropharynx is clear and moist. No oropharyngeal exudate.  No drooling or stridor. Posterior pharynx mildly erythematous no significant tonsillar hypertrophy. No exudate. Soft palate rises symmetrically. No TTP or induration under tongue.   No tenderness to palpation of frontal or bilateral maxillary  sinuses.  Mild mucosal edema in the nares with scant rhinorrhea.  Bilateral tympanic membranes with normal architecture and good light reflex.    Eyes: Conjunctivae and EOM are normal. Pupils are equal, round, and reactive to light.  Neck: Normal range of motion. Neck supple.  Cardiovascular: Normal rate and regular rhythm.   Pulmonary/Chest: Effort normal and breath sounds normal. No stridor. No respiratory distress. She has no wheezes. She has no rales. She exhibits no tenderness.  Abdominal: Soft. Bowel sounds are normal. She exhibits no distension and no mass. There is no tenderness. There is no rebound and no guarding.  Genitourinary:  Genitourinary Comments: No CVA tenderness to percussion bilaterally  Musculoskeletal: Normal range of motion.  Neurological: She is alert and oriented to person, place, and time.  Psychiatric: She has a normal mood and affect.  Nursing note and vitals reviewed.    ED Treatments / Results  DIAGNOSTIC STUDIES: Oxygen Saturation is 99% on RA, normal by my interpretation.    COORDINATION OF CARE: 12:18 AM- Pt advised of plan for treatment and pt agrees.  Labs (all labs ordered are listed, but only abnormal results are displayed) Labs Reviewed  COMPREHENSIVE METABOLIC PANEL - Abnormal; Notable for the following:       Result Value   CO2 21 (*)    Glucose, Bld 113 (*)    All other components within normal limits  URINALYSIS, ROUTINE W REFLEX MICROSCOPIC - Abnormal; Notable for the following:    Specific Gravity, Urine 1.032 (*)    Leukocytes, UA SMALL (*)    Bacteria, UA RARE (*)    Squamous Epithelial / LPF 0-5 (*)    All other components within normal limits  CBC WITH DIFFERENTIAL/PLATELET  PREGNANCY, URINE  I-STAT CG4 LACTIC ACID, ED    EKG  EKG Interpretation None       Radiology Dg Chest 2 View  Result Date: 12/29/2016 CLINICAL DATA:  Cough and fever with shortness of breath EXAM: CHEST  2 VIEW COMPARISON:  June 23, 2013  FINDINGS: There is no edema or consolidation. The heart size and pulmonary vascularity are normal. No adenopathy. No pneumothorax. No bone lesions. IMPRESSION: No edema or consolidation. Electronically Signed   By: Bretta Bang III M.D.   On: 12/29/2016 19:41    Procedures Procedures (including critical care time)  Medications Ordered in ED Medications  ondansetron (ZOFRAN-ODT) disintegrating tablet 4 mg (4 mg Oral Given 12/29/16 2135)  oseltamivir (TAMIFLU) capsule 75 mg (75 mg Oral Given 12/29/16 2136)  sodium chloride 0.9 % bolus 2,000 mL (2,000 mLs Intravenous New Bag/Given 12/29/16 2224)  ketorolac (TORADOL) 30 MG/ML injection 30 mg (30 mg Intravenous Given 12/29/16 2222)     Initial Impression / Assessment and Plan / ED Course  I have reviewed the triage vital signs and the nursing notes.  Pertinent labs & imaging results that were available during my  care of the patient were reviewed by me and considered in my medical decision making (see chart for details).  Clinical Course as of Dec 30 16  Mon Dec 29, 2016  2200 Agent given antipyretics, she's tolerating oral fluids however her temperature is rising and she remains tachycardic, will give IV fluids and Toradol.  [NP]    Clinical Course User Index [NP] Dawn Emery, PA-C    Vitals:   12/29/16 1846 12/29/16 2138 12/29/16 2342  BP: 141/80 111/70 (!) 97/53  Pulse: (!) 125 115 96  Resp: 18 17 17   Temp: 100.4 F (38 C) 102.7 F (39.3 C) 100.3 F (37.9 C)  TempSrc: Oral Oral Oral  SpO2: 99% 97% 100%    Medications  ondansetron (ZOFRAN-ODT) disintegrating tablet 4 mg (4 mg Oral Given 12/29/16 2135)  oseltamivir (TAMIFLU) capsule 75 mg (75 mg Oral Given 12/29/16 2136)  sodium chloride 0.9 % bolus 2,000 mL (2,000 mLs Intravenous New Bag/Given 12/29/16 2224)  ketorolac (TORADOL) 30 MG/ML injection 30 mg (30 mg Intravenous Given 12/29/16 2222)    Adysen Raphael is 26 y.o. female presenting with Influenza-like illness  partial symptom onset yesterday and some more today. Patient is overall nontoxic appearing, low-grade fever and tachycardic to 125. No comorbidities, no indication for Tamiflu, in shared decision-making have discussed pros and cons of initiating Tamiflu and patient would like to start the medication. Will give Tylenol for antipyretic, recheck heart rate if she remains tachycardic will give IV fluids and check lactic acid. Blood work reassuring with no significant abnormalities, chest x-ray without signs of a secondary bacterial infection. Urinalysis with trace leukocytes however no bacteria, patient without UTI symptoms, will defer treatment and culture.  After fluids the tachycardia has resolved, she has a normal lactic acid level however it is not crossing over into the system. Tolerating by mouth's and states that she feels much better. Discharged home with strict return precautions.  Evaluation does not show pathology that would require ongoing emergent intervention or inpatient treatment. Pt is hemodynamically stable and mentating appropriately. Discussed findings and plan with patient/guardian, who agrees with care plan. All questions answered. Return precautions discussed and outpatient follow up given.      Final Clinical Impressions(s) / ED Diagnoses   Final diagnoses:  Influenza-like illness    New Prescriptions New Prescriptions   ONDANSETRON (ZOFRAN ODT) 4 MG DISINTEGRATING TABLET    Take 1 tablet (4 mg total) by mouth every 8 (eight) hours as needed for nausea or vomiting.   OSELTAMIVIR (TAMIFLU) 75 MG CAPSULE    Take 1 capsule (75 mg total) by mouth every 12 (twelve) hours.      Dawn Emery, PA-C 12/30/16 0019    Arby Barrette, MD 12/30/16 [phone]

## 2016-12-29 NOTE — Discharge Instructions (Signed)
Return to the emergency room for any worsening or concerning symptoms including fast breathing, heart racing, confusion, vomiting.  Rest, cover your mouth when you cough and wash your hands frequently.   Push fluids: water or Gatorade, do not drink any soda, juice or caffeinated beverages.  For fever and pain control you can take Motrin (ibuprofen) as follows: 400 mg (this is normally 2 over the counter pills), then 3 hours later you can have Tylenol (centimeters acetaminophen, 325 mg), then repeat this at all times to keep the fever and control while you are awake.   Do not return to work until 2 days after your fever breaks.

## 2016-12-29 NOTE — ED Triage Notes (Signed)
Pt complaint of chills/fever, cough, n/v, and generalized aches onset today. Pt verbalizes took tylenol at 1600 today.

## 2016-12-29 NOTE — ED Notes (Signed)
Pt given gingerale for fluid challenge. Will re-assess to see how pt tolerates.

## 2016-12-30 LAB — I-STAT CG4 LACTIC ACID, ED
LACTIC ACID, VENOUS: 0.87 mmol/L (ref 0.5–1.9)
Lactic Acid, Venous: 0.87 mmol/L (ref 0.5–1.9)

## 2017-02-12 ENCOUNTER — Ambulatory Visit (INDEPENDENT_AMBULATORY_CARE_PROVIDER_SITE_OTHER): Payer: 59 | Admitting: Family Medicine

## 2017-02-12 ENCOUNTER — Encounter: Payer: Self-pay | Admitting: Family Medicine

## 2017-02-12 VITALS — BP 128/70 | HR 71 | Resp 12 | Ht 65.0 in | Wt 208.1 lb

## 2017-02-12 DIAGNOSIS — F331 Major depressive disorder, recurrent, moderate: Secondary | ICD-10-CM | POA: Diagnosis not present

## 2017-02-12 DIAGNOSIS — Z6834 Body mass index (BMI) 34.0-34.9, adult: Secondary | ICD-10-CM

## 2017-02-12 DIAGNOSIS — F339 Major depressive disorder, recurrent, unspecified: Secondary | ICD-10-CM | POA: Insufficient documentation

## 2017-02-12 DIAGNOSIS — G4700 Insomnia, unspecified: Secondary | ICD-10-CM | POA: Insufficient documentation

## 2017-02-12 DIAGNOSIS — E669 Obesity, unspecified: Secondary | ICD-10-CM | POA: Insufficient documentation

## 2017-02-12 MED ORDER — FLUOXETINE HCL 10 MG PO CAPS: 10.0000 mg | ORAL_CAPSULE | Freq: Every day | ORAL | 0 refills | Status: DC

## 2017-02-12 MED ORDER — MIRTAZAPINE 15 MG PO TABS: 15.0000 mg | ORAL_TABLET | Freq: Every day | ORAL | 1 refills | Status: DC

## 2017-02-12 NOTE — Progress Notes (Signed)
HPI:   [name] is a 26 y.o. female, who is here today to establish care with me.  Former PCP: N/A Last preventive routine visit: She sees her gyn periodically, Dawn Barnett.  Chronic medical problems: Depression.   Concerns today: "Personal issues"   Hx of depression and feeling anxious for the past few weeks. She states that she has not been able to sleep in the past 3 days, states that her "mind is running." Diagnosed with depression about 2 years ago when she was pregnant with her second chil, reporting post partum depression and according to pt, she did not received pharmacologic treatment. She has not seen psychiatrists before and has not tried medication in the past.  She denies suicidal thoughts.  She lives with her 2 daughters, 1016 and 27 years old.  Other stressors: Father is "terminal ill", Hx of alcoholism,cirrhosis, and hepatitis C.  Father with Hx of depression.  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  -She does not follow a healthy diet . She just started drinking more water and decrease meet intake but still eating carbs and sweets. She does not exercises regularly.  - Having rhinorrhea and nasal congestion since 12/2016 when she was Dx with influenza. No fever or chills.   Review of Systems  Constitutional: Positive for fatigue. Negative for appetite change, chills, fever and unexpected weight change.  HENT: Positive for congestion and rhinorrhea. Negative for mouth sores, nosebleeds, sinus pressure and trouble swallowing.   Respiratory: Negative for cough, shortness of breath and wheezing.   Cardiovascular: Negative for chest pain, palpitations and leg swelling.  Gastrointestinal: Negative for abdominal pain, nausea and vomiting.       Negative for changes in bowel habits.  Endocrine: Negative for cold intolerance and  heat intolerance.  Musculoskeletal: Negative for arthralgias and myalgias.  Skin: Negative for rash.  Neurological: Negative for syncope, weakness and headaches.  Hematological: Negative for adenopathy. Does not bruise/bleed easily.  Psychiatric/Behavioral: Positive for decreased concentration and sleep disturbance. Negative for confusion, hallucinations, self-injury and suicidal ideas. The patient is nervous/anxious.       Current Outpatient Prescriptions on File Prior to Visit  Medication Sig Dispense Refill  . acetaminophen (TYLENOL) 500 MG tablet Take 1,000 mg by mouth every 6 (six) hours as needed.    . etonogestrel (NEXPLANON) 68 MG IMPL implant 1 each by Subdermal route once.     No current facility-administered medications on file prior to visit.      Past Medical History:  Diagnosis Date  . Abnormal Pap smear 01/2009  . BV (bacterial vaginosis) 2010  . Dysplasia 2010  . H/O cervicitis 2010  . H/O gonorrhea 2010  . H/O varicella   . History of chlamydia 06/05/09  . HPV in female   . Irregular periods/menstrual cycles   . Itching 09/04/2009  . Pelvic pain in female 2010  . Syphilis 2006  . Weight loss    No Known Allergies  Family History  Problem Relation Age of Onset  . Drug abuse Father   . Alcohol abuse Father   . COPD Maternal Grandmother     Emphysema    Social History   Social History  . Marital status: Single    Spouse name: N/A  . Number of children: N/A  . Years of education: N/A  Social History Main Topics  . Smoking status: Former Smoker    Packs/day: 0.25    Quit date: 03/21/2012  . Smokeless tobacco: Never Used  . Alcohol use No  . Drug use: No     Comment: 4 weeks ago  . Sexual activity: Yes    Birth control/ protection: None   Other Topics Concern  . None   Social History Narrative  . None    Vitals:   02/12/17 1138  BP: 128/70  Pulse: 71  Resp: 12   O2 sat 96% at RA. Body mass index is 34.63 kg/m.   Physical Exam    Nursing note and vitals reviewed. Constitutional: She is oriented to person, place, and time. She appears well-developed. No distress.  HENT:  Head: Atraumatic.  Nose: Rhinorrhea present.  Mouth/Throat: Oropharynx is clear and moist and mucous membranes are normal.  Eyes: Conjunctivae and EOM are normal. Pupils are equal, round, and reactive to light.  Cardiovascular: Normal rate and regular rhythm.   No murmur heard. Pulses:      Dorsalis pedis pulses are 2+ on the right side, and 2+ on the left side.  Respiratory: Effort normal and breath sounds normal. No respiratory distress.  GI: Soft. She exhibits no mass. There is no hepatomegaly. There is no tenderness.  Musculoskeletal: She exhibits no edema or tenderness.  Lymphadenopathy:    She has no cervical adenopathy.  Neurological: She is alert and oriented to person, place, and time. She has normal strength. Coordination and gait normal.  Skin: Skin is warm. No erythema.  Psychiatric: She exhibits a depressed mood. She expresses no suicidal ideation. She expresses no suicidal plans.  Well groomed, good eye contact.      ASSESSMENT AND PLAN:   Dawn Barnett was seen today for establish care.  Diagnoses and all orders for this visit:  Insomnia, unspecified type  We discussed possible causes and treatment options. Good sleep hygiene. I recommend Remeron because is the symptoms that is affecting her the most at this time. Also it may help with depression. Some side effects discussed. F/U in 2-3 weeks.  -     mirtazapine (REMERON) 15 MG tablet; Take 1 tablet (15 mg total) by mouth at bedtime.  Moderate episode of recurrent major depressive disorder (HCC)  Remeron and Fluoxetine recommended today, recommend starting one at the time. Side effects discussed. Clearly instructed about warning signs. List of psychiatrists in the area given, so she can try to establish with one. I will see her back in 2-3 weeks.   -     FLUoxetine  (PROZAC) 10 MG capsule; Take 1 capsule (10 mg total) by mouth daily.   BMI 34.0-34.9,adult  We discussed benefits of wt loss as well as adverse effects of obesity. Consistency with healthy diet and physical activity recommended. Daily brisk walking for 15-30 min as tolerated.     Dawn Suttles G. [place], MD  [facility]. [place] office.

## 2017-02-12 NOTE — Patient Instructions (Signed)
A few things to remember from today's visit:   Insomnia, unspecified type - Plan: mirtazapine (REMERON) 15 MG tablet  Moderate episode of recurrent major depressive disorder (HCC) - Plan: FLUoxetine (PROZAC) 10 MG capsule  PSYCJHIATRIC OFFICES AND PSYCHIATRISTS IN THE AREA   When psychiatric evaluation is discussed or recommended referral is not necessary. This is a list of options around [place], [place] that you can call and arrange an appointment.  -[facility] [phone] -[facility] [phone] [facility]- [facility] [phone] -[facility] [phone]  Dr Annabell SabalMichael F lefaive [phone] Dr Marguerita BeardsKenneth J. Headen, MD [phone] Dr Milagros Evenerupinder [facility]aur, MD [phone]   Dr Jacqulynn Cadetarey G. Bernardo Heaterottle Jr, MD [phone] Dr Madie RenoIrving A. Lugo  Dr Andee PolesMcKinney, Parish, MD [phone]  Dr Mar DaringWangelin, Robert, MD [phone]  Today we started 2 medications for depression. These type of medications can increase suicidal risk. This is more prevalent among children,adolecents, and young adults with major depression or other psychiatric disorders. It can also make depression worse. Most common side effects are gastrointestinal, self limited after a few weeks: diarrhea, nausea, constipation  Or diarrhea among some.  In general it is well tolerated. We will follow closely.       Please be sure medication list is accurate. If a new problem present, please set up appointment sooner than planned today.

## 2017-02-12 NOTE — Progress Notes (Signed)
Pre visit review using our clinic review tool, if applicable. No additional management support is needed unless otherwise documented below in the visit note. 

## 2017-02-16 ENCOUNTER — Telehealth: Payer: Self-pay | Admitting: Family Medicine

## 2017-02-16 DIAGNOSIS — F331 Major depressive disorder, recurrent, moderate: Secondary | ICD-10-CM

## 2017-02-16 NOTE — Telephone Encounter (Signed)
Referral okay? 

## 2017-02-16 NOTE — Telephone Encounter (Signed)
Referral placed.

## 2017-02-16 NOTE — Telephone Encounter (Signed)
Pt need a referral to the [facility]  [phone])  [facility]

## 2017-02-16 NOTE — Telephone Encounter (Signed)
I do not think "neuropsychiatric" evaluation but rather psychiatric one due to depression.  I think I gave her a list of providers in the area but at the same time I started treatment and recommended 3 weeks follow up.

## 2017-03-04 NOTE — Progress Notes (Deleted)
      HPI:   Ms.Jasiyah Jerold CoombeFransay Borger is a 26 y.o. female, who is here today to follow on last OV, 02/12/17.   Last OV she was c/o worsening anxiety and depression as well as insomnia. "Mind running" and not able to sleep for days.  Remeron 15 mg and Fluoxetine 10 mg started. I gave her a list of mental health providers in the area and recommended establishing with one. She called a few days ago requesting referral to neuropsychiatrists ***  She has tolerated medication ***  Denies suicidal thoughts. Sleeping about ***      Review of Systems    Current Outpatient Prescriptions on File Prior to Visit  Medication Sig Dispense Refill  . acetaminophen (TYLENOL) 500 MG tablet Take 1,000 mg by mouth every 6 (six) hours as needed.    . etonogestrel (NEXPLANON) 68 MG IMPL implant 1 each by Subdermal route once.    Marland Kitchen. FLUoxetine (PROZAC) 10 MG capsule Take 1 capsule (10 mg total) by mouth daily. 30 capsule 0  . mirtazapine (REMERON) 15 MG tablet Take 1 tablet (15 mg total) by mouth at bedtime. 30 tablet 1   No current facility-administered medications on file prior to visit.      Past Medical History:  Diagnosis Date  . Abnormal Pap smear 01/2009  . BV (bacterial vaginosis) 2010  . Dysplasia 2010  . H/O cervicitis 2010  . H/O gonorrhea 2010  . H/O varicella   . History of chlamydia 06/05/09  . HPV in female   . Irregular periods/menstrual cycles   . Itching 09/04/2009  . Pelvic pain in female 2010  . Syphilis 2006  . Weight loss    No Known Allergies  Social History   Social History  . Marital status: Single    Spouse name: N/A  . Number of children: N/A  . Years of education: N/A   Social History Main Topics  . Smoking status: Former Smoker    Packs/day: 0.25    Quit date: 03/21/2012  . Smokeless tobacco: Never Used  . Alcohol use No  . Drug use: No     Comment: 4 weeks ago  . Sexual activity: Yes    Birth control/ protection: None   Other Topics Concern    . Not on file   Social History Narrative  . No narrative on file    There were no vitals filed for this visit. There is no height or weight on file to calculate BMI.      Physical Exam    ASSESSMENT AND PLAN:     There are no diagnoses linked to this encounter.         -Ms. Alphonsa GinKemiyah Jerold CoombeFransay Maull was advised to return sooner than planned today if new concerns arise.       Raphaella Larkin G. [place], MD  [facility]. [place] office.

## 2017-03-05 ENCOUNTER — Ambulatory Visit: Payer: 59 | Admitting: Family Medicine

## 2017-03-11 ENCOUNTER — Encounter (HOSPITAL_COMMUNITY): Payer: Self-pay | Admitting: Emergency Medicine

## 2017-03-11 ENCOUNTER — Emergency Department (HOSPITAL_COMMUNITY)
Admission: EM | Admit: 2017-03-11 | Discharge: 2017-03-11 | Disposition: A | Discharge status: Home / Self Care | Payer: 59 | Attending: Emergency Medicine | Admitting: Emergency Medicine

## 2017-03-11 ENCOUNTER — Emergency Department (HOSPITAL_COMMUNITY): Payer: 59

## 2017-03-11 DIAGNOSIS — Z87891 Personal history of nicotine dependence: Secondary | ICD-10-CM | POA: Diagnosis not present

## 2017-03-11 DIAGNOSIS — N644 Mastodynia: Secondary | ICD-10-CM | POA: Diagnosis present

## 2017-03-11 LAB — I-STAT TROPONIN, ED: Troponin i, poc: 0 ng/mL (ref 0.00–0.08)

## 2017-03-11 LAB — CBC
HCT: 37.3 % (ref 36.0–46.0)
HEMOGLOBIN: 12.5 g/dL (ref 12.0–15.0)
MCH: 30 pg (ref 26.0–34.0)
MCHC: 33.5 g/dL (ref 30.0–36.0)
MCV: 89.7 fL (ref 78.0–100.0)
Platelets: 239 10*3/uL (ref 150–400)
RBC: 4.16 MIL/uL (ref 3.87–5.11)
RDW: 12.6 % (ref 11.5–15.5)
WBC: 5.2 10*3/uL (ref 4.0–10.5)

## 2017-03-11 LAB — BASIC METABOLIC PANEL
ANION GAP: 6 (ref 5–15)
BUN: 12 mg/dL (ref 6–20)
CHLORIDE: 106 mmol/L (ref 101–111)
CO2: 26 mmol/L (ref 22–32)
CREATININE: 0.94 mg/dL (ref 0.44–1.00)
Calcium: 9.1 mg/dL (ref 8.9–10.3)
GFR calc non Af Amer: >60 mL/min (ref >60)
GLUCOSE: 104 mg/dL — AB (ref 65–99)
Potassium: 3.9 mmol/L (ref 3.5–5.1)
Sodium: 138 mmol/L (ref 135–145)

## 2017-03-11 MED ORDER — IBUPROFEN 800 MG PO TABS: 800.0000 mg | ORAL_TABLET | Freq: Three times a day (TID) | ORAL | 0 refills | Status: DC

## 2017-03-11 MED ORDER — AMOXICILLIN-POT CLAVULANATE 875-125 MG PO TABS: 1.0000 | ORAL_TABLET | Freq: Two times a day (BID) | ORAL | 0 refills | Status: DC

## 2017-03-11 NOTE — ED Triage Notes (Signed)
Patient reports left/central chest and left breast pain x 5 days. Reports shortness of breath. Denies nausea, vomiting, diarrhea.

## 2017-03-11 NOTE — ED Provider Notes (Signed)
[facility]-EMERGENCY DEPT Provider Note   CSN: [id number] Arrival date & time: 03/11/17  1437     History   Chief Complaint Chief Complaint  Patient presents with  . Chest Pain  . Breast Pain    HPI Dawn Barnett is a 26 y.o. female.  The history is provided by the patient and medical records.  Chest Pain      26 year old female with history of HPV and multiple cervical issues, history of syphilis, depression, presenting to the ED for left-sided breast/chest pain. She reports this has been ongoing for the past 5 days. States pain is worse with palpation. States she has been sleeping on her side with heating pad between her breasts with some relief.  She denies any shortness of breath, diaphoresis, nausea, vomiting, or diarrhea. She has no known cardiac history. She is a former smoker.  Patient reports she does have history of prior breast cysts that were evaluated by [place].  States she did notice her left breast started appearing somewhat red today.  She denies fever/chills.  No hx of MRSA, HIV, or DM.  No hx of breast cancer in the family.  No recent nipple discharge or bleeding.  Past Medical History:  Diagnosis Date  . Abnormal Pap smear 01/2009  . BV (bacterial vaginosis) 2010  . Dysplasia 2010  . H/O cervicitis 2010  . H/O gonorrhea 2010  . H/O varicella   . History of chlamydia 06/05/09  . HPV in female   . Irregular periods/menstrual cycles   . Itching 09/04/2009  . Pelvic pain in female 2010  . Syphilis 2006  . Weight loss     Patient Active Problem List   Diagnosis Date Noted  . Major depression, recurrent (HCC) 02/12/2017  . BMI 34.0-34.9,adult 02/12/2017  . Insomnia 02/12/2017  . Vaginal delivery 04/23/2014  . Gonorrhea complicating pregnancy 09/12/2013  . Smoker 09/12/2013  . Hyperemesis arising during pregnancy 09/12/2013  . Hx of syphilis 09/12/2013  . 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 Hx of chlamydia infection 09/12/2013    History reviewed. No pertinent  surgical history.  OB History    Gravida Para Term Preterm AB Living   2 2 2     2    SAB TAB Ectopic Multiple Live Births           2       Home Medications    Prior to Admission medications   Medication Sig Start Date End Date Taking? Authorizing Provider  acetaminophen (TYLENOL) 500 MG tablet Take 1,000 mg by mouth every 6 (six) hours as needed.    Historical Provider, MD  etonogestrel (NEXPLANON) 68 MG IMPL implant 1 each by Subdermal route once.    Historical Provider, MD  FLUoxetine (PROZAC) 10 MG capsule Take 1 capsule (10 mg total) by mouth daily. 02/12/17   Betty G [place], MD  mirtazapine (REMERON) 15 MG tablet Take 1 tablet (15 mg total) by mouth at bedtime. 02/12/17   Betty G [place], MD    Family History Family History  Problem Relation Age of Onset  . Drug abuse Father   . Alcohol abuse Father   . COPD Maternal Grandmother     Emphysema    Social History Social History  Substance Use Topics  . Smoking status: Former Smoker    Packs/day: 0.25    Quit date: 03/21/2012  . Smokeless tobacco: Never Used  . Alcohol use No     Allergies   Patient has no known allergies.  Review of Systems Review of Systems  Cardiovascular: Positive for chest pain (left breast).  All other systems reviewed and are negative.    Physical Exam Updated Vital Signs BP 119/75 (BP Location: Left Arm)   Pulse 75   Temp 98.3 F (36.8 C) (Oral)   Resp 18   Ht 5\' 5"  (1.651 m)   Wt 94.3 kg   SpO2 99%   BMI 34.61 kg/m   Physical Exam  Constitutional: She is oriented to person, place, and time. She appears well-developed and well-nourished.  HENT:  Head: Normocephalic and atraumatic.  Mouth/Throat: Oropharynx is clear and moist.  Eyes: Conjunctivae and EOM are normal. Pupils are equal, round, and reactive to light.  Neck: Normal range of motion.  Cardiovascular: Normal rate, regular rhythm and normal heart sounds.   Pulmonary/Chest: Effort normal and breath sounds normal. No  respiratory distress. She has no wheezes.  Left breast mildly erythematous along the upper medial aspect without streaking; there is no definitive mass or fluctuance noted; breasts do feel as if there is some cystic changes; no nipple discharge or bleeding  Abdominal: Soft. Bowel sounds are normal. There is no tenderness. There is no rebound.  Musculoskeletal: Normal range of motion.  Neurological: She is alert and oriented to person, place, and time.  Skin: Skin is warm and dry.  Psychiatric: She has a normal mood and affect.  Nursing note and vitals reviewed.    ED Treatments / Results  Labs (all labs ordered are listed, but only abnormal results are displayed) Labs Reviewed  BASIC METABOLIC PANEL - Abnormal; Notable for the following:       Result Value   Glucose, Bld 104 (*)    All other components within normal limits  CBC  I-STAT TROPOININ, ED    EKG  EKG Interpretation None       Radiology Dg Chest 2 View  Result Date: 03/11/2017 CLINICAL DATA:  Central and left-sided chest pain over the last 4 days. EXAM: CHEST  2 VIEW COMPARISON:  12/29/2016 FINDINGS: Normal chest IMPRESSION: Normal Electronically Signed   By: Paulina FusiMark  Shogry M.D.   On: 03/11/2017 15:33    Procedures Procedures (including critical care time)  Medications Ordered in ED Medications - No data to display   Initial Impression / Assessment and Plan / ED Course  I have reviewed the triage vital signs and the nursing notes.  Pertinent labs & imaging results that were available during my care of the patient were reviewed by me and considered in my medical decision making (see chart for details).  26 year old female here with left breast/chest pain for the past 5 days.  She is afebrile and nontoxic. She has no known cardiac history.  EKG, labs, and CXR obtained from triage are all reassuring.  After history and physical exam, it appears the patient symptoms are related to the left breast. I do not suspect  ACS, PE, or other cardiac etiology of her symptoms.  She may have a developing cellulitis, but I do not see any signs of definitive abscess formation at this time. She does have some cystic feeling changes of her breasts, has history of same.  Will start on abx, arrange for FU ultrasound at breast center.  Will also have her follow-up with her PCP.  Discussed plan with patient, she acknowledged understanding and agreed with plan of care.  Return precautions given for new or worsening symptoms.  Final Clinical Impressions(s) / ED Diagnoses   Final diagnoses:  Breast  pain, left    New Prescriptions Discharge Medication List as of 03/11/2017  7:42 PM    START taking these medications   Details  amoxicillin-clavulanate (AUGMENTIN) 875-125 MG tablet Take 1 tablet by mouth every 12 (twelve) hours., Starting Wed 03/11/2017, Print         Garlon Hatchet, PA-C 03/11/17 2032    Derwood Kaplan, MD 03/12/17 [phone]

## 2017-03-11 NOTE — Discharge Instructions (Signed)
Take the prescribed medication as directed.  Can continue warm compresses at home. Follow-up with the breast center-- call in the morning to schedule your appt. Return to the ED for new or worsening symptoms.

## 2017-03-12 ENCOUNTER — Other Ambulatory Visit (HOSPITAL_COMMUNITY): Payer: Self-pay | Admitting: Emergency Medicine

## 2017-03-12 ENCOUNTER — Telehealth: Payer: Self-pay | Admitting: Family Medicine

## 2017-03-12 DIAGNOSIS — F331 Major depressive disorder, recurrent, moderate: Secondary | ICD-10-CM

## 2017-03-12 DIAGNOSIS — N644 Mastodynia: Secondary | ICD-10-CM

## 2017-03-12 DIAGNOSIS — G4700 Insomnia, unspecified: Secondary | ICD-10-CM

## 2017-03-12 MED ORDER — FLUOXETINE HCL 10 MG PO CAPS: 10.0000 mg | ORAL_CAPSULE | Freq: Every day | ORAL | 0 refills | Status: DC

## 2017-03-12 MED ORDER — MIRTAZAPINE 15 MG PO TABS: 15.0000 mg | ORAL_TABLET | Freq: Every day | ORAL | 0 refills | Status: DC

## 2017-03-12 NOTE — Telephone Encounter (Signed)
Rx's sent. Patient needs follow up appointment for future refills. She no-showed her appointment on 3/22.

## 2017-03-12 NOTE — Telephone Encounter (Signed)
Pt was seen on 02-12-17 and per pt pharm never received rxs for fluoxetine and mirtazapine. [facility]

## 2017-03-17 NOTE — Progress Notes (Deleted)
HPI:   [name] is a 26 y.o. female, who is here today to follow recent ER visit.  She was seen on 03/11/17 in the ED, she was c/o left breast pain. Cellulitis left breast. She was started on abx (Augmentin) and breast U/S was arranged.  Lab Results  Component Value Date   CREATININE 0.94 03/11/2017   BUN 12 03/11/2017   NA 138 03/11/2017   K 3.9 03/11/2017   CL 106 03/11/2017   CO2 26 03/11/2017   Lab Results  Component Value Date   WBC 5.2 03/11/2017   HGB 12.5 03/11/2017   HCT 37.3 03/11/2017   MCV 89.7 03/11/2017   PLT 239 03/11/2017      Overall *** is feeling better, no new symptoms reported.       -She missed appt on 03/05/17 to follow on last OV, 02/12/17.   Last OV she was c/o worsening anxiety and depression as well as insomnia. "Mind running" and not able to sleep for days.  Remeron 15 mg and Fluoxetine 10 mg started. I gave her a list of mental health providers in the area and recommended establishing with one. She called a few days ago requesting referral to neuropsychiatrists ***  She has tolerated medication ***  Denies suicidal thoughts. Sleeping about ***      Review of Systems     Current Outpatient Prescriptions on File Prior to Visit  Medication Sig Dispense Refill  . acetaminophen (TYLENOL) 500 MG tablet Take 1,000 mg by mouth every 6 (six) hours as needed.    Marland Kitchen amoxicillin-clavulanate (AUGMENTIN) 875-125 MG tablet Take 1 tablet by mouth every 12 (twelve) hours. 20 tablet 0  . etonogestrel (NEXPLANON) 68 MG IMPL implant 1 each by Subdermal route once.    Marland Kitchen FLUoxetine (PROZAC) 10 MG capsule Take 1 capsule (10 mg total) by mouth daily. 30 capsule 0  . ibuprofen (ADVIL,MOTRIN) 800 MG tablet Take 1 tablet (800 mg total) by mouth 3 (three) times daily. 21 tablet 0  . mirtazapine (REMERON) 15 MG tablet Take 1 tablet (15 mg total) by mouth at bedtime. 30 tablet 0   No current facility-administered medications on  file prior to visit.      Past Medical History:  Diagnosis Date  . Abnormal Pap smear 01/2009  . BV (bacterial vaginosis) 2010  . Dysplasia 2010  . H/O cervicitis 2010  . H/O gonorrhea 2010  . H/O varicella   . History of chlamydia 06/05/09  . HPV in female   . Irregular periods/menstrual cycles   . Itching 09/04/2009  . Pelvic pain in female 2010  . Syphilis 2006  . Weight loss    No Known Allergies  Social History   Social History  . Marital status: Single    Spouse name: N/A  . Number of children: N/A  . Years of education: N/A   Social History Main Topics  . Smoking status: Former Smoker    Packs/day: 0.25    Quit date: 03/21/2012  . Smokeless tobacco: Never Used  . Alcohol use No  . Drug use: No     Comment: 4 weeks ago  . Sexual activity: Yes    Birth control/ protection: None   Other Topics Concern  . Not on file   Social History Narrative  . No narrative on file    There were no vitals filed for this visit. There is no height or weight on file to calculate BMI.  Physical Exam     ASSESSMENT AND PLAN:     There are no diagnoses linked to this encounter.         -Dawn Barnett was advised to return sooner than planned today if new concerns arise.       Amery Vandenbos G. [place], MD  [facility]. [place] office.

## 2017-03-18 ENCOUNTER — Ambulatory Visit: Payer: 59 | Admitting: Family Medicine

## 2017-03-25 NOTE — Progress Notes (Signed)
HPI:   Dawn Barnett is a 26 y.o. female, who is here today to follow recent ER visit.  She was seen on 03/11/17 in the ED, she was c/o left breast pain. Dx with Cellulitis left breast. She was started on abx (Augmentin) and breast U/S was arranged. Breast pain and erythema have resolved,she has not had imaging done,today she received an call from breast center and appt was arranged.   Lab Results  Component Value Date   CREATININE 0.94 03/11/2017   BUN 12 03/11/2017   NA 138 03/11/2017   K 3.9 03/11/2017   CL 106 03/11/2017   CO2 26 03/11/2017   Lab Results  Component Value Date   WBC 5.2 03/11/2017   HGB 12.5 03/11/2017   HCT 37.3 03/11/2017   MCV 89.7 03/11/2017   PLT 239 03/11/2017     Today she is c/o mid chest pain ,left chondrocostal area. She has had this pain since late 02/2017. Achy pain,no radiated, intermittent,6/10. Exacerbated by lying down,deep breathing and alleviated by wearing sport bra.  She denies injuries. No rash. CXR was done 03/11/17:Normal.   -She missed appt on 03/05/17 and 03/18/17,both were arranged to follow on last OV, 02/12/17.   Last OV she was c/o worsening anxiety and depression as well as insomnia. "Mind running" and not able to sleep for days. Remeron 15 mg and Fluoxetine 10 mg were recommended,both she just started about 2 weeks ago. Medications have helped, she feels like she is doing better at work,"able to focus",no anxiety attacks. She is sleeping 8 hours and feels rested next day. She denies suicidal thoughts or hallucinations.    I gave her a list of mental health providers in the area and recommended establishing with one.  She called a few days after last OV, requesting referral to neuropsychiatrists. Referral was placed, she states that she has not heard about appt arrangements.     -Wt loss pill: She is also concerned about obesity and requesting Phentermine, which according to pt,was suggested by her  mother. Since her last OV: She is not exercising regularly. Increased water intake. She is not eating breakfast. Lunch yesterday: ham sandwich with sweet tea. Dinner last night: fries with hamburger.    Review of Systems  Constitutional: Negative for appetite change, fatigue and fever.  HENT: Negative for mouth sores, nosebleeds and trouble swallowing.   Eyes: Negative for redness and visual disturbance.  Respiratory: Negative for cough, shortness of breath and wheezing.   Cardiovascular: Negative for palpitations and leg swelling.  Gastrointestinal: Negative for abdominal pain, nausea and vomiting.       Negative for changes in bowel habits.  Endocrine: Negative for cold intolerance and heat intolerance.  Genitourinary: Negative for decreased urine volume and hematuria.  Musculoskeletal: Negative for back pain, gait problem and neck pain.  Skin: Negative for rash.  Neurological: Negative for syncope, weakness and headaches.  Psychiatric/Behavioral: Negative for confusion, hallucinations and suicidal ideas. The patient is nervous/anxious.       Current Outpatient Prescriptions on File Prior to Visit  Medication Sig Dispense Refill  . etonogestrel (NEXPLANON) 68 MG IMPL implant 1 each by Subdermal route once.     No current facility-administered medications on file prior to visit.      Past Medical History:  Diagnosis Date  . Abnormal Pap smear 01/2009  . BV (bacterial vaginosis) 2010  . Dysplasia 2010  . H/O cervicitis 2010  . H/O gonorrhea 2010  . H/O  varicella   . History of chlamydia 06/05/09  . HPV in female   . Irregular periods/menstrual cycles   . Itching 09/04/2009  . Pelvic pain in female 2010  . Syphilis 2006  . Weight loss    No Known Allergies  Social History   Social History  . Marital status: Single    Spouse name: N/A  . Number of children: N/A  . Years of education: N/A   Social History Main Topics  . Smoking status: Former Smoker     Packs/day: 0.25    Quit date: 03/21/2012  . Smokeless tobacco: Never Used  . Alcohol use No  . Drug use: No     Comment: 4 weeks ago  . Sexual activity: Yes    Birth control/ protection: None   Other Topics Concern  . None   Social History Narrative  . None    Vitals:   03/26/17 1132  BP: 118/70  Pulse: 86  Resp: 12   Body mass index is 34.63 kg/m.  Wt Readings from Last 3 Encounters:  03/26/17 208 lb 2 oz (94.4 kg)  03/11/17 208 lb (94.3 kg)  02/12/17 208 lb 2 oz (94.4 kg)    Physical Exam  Nursing note and vitals reviewed. Constitutional: She is oriented to person, place, and time. She appears well-developed. No distress.  HENT:  Head: Atraumatic.  Mouth/Throat: Oropharynx is clear and moist and mucous membranes are normal.  Eyes: Conjunctivae and EOM are normal. Pupils are equal, round, and reactive to light.  Neck: No tracheal deviation present. No thyroid mass and no thyromegaly present.  Cardiovascular: Normal rate and regular rhythm.   No murmur heard. Pulses:      Dorsalis pedis pulses are 2+ on the right side, and 2+ on the left side.  Respiratory: Effort normal and breath sounds normal. No respiratory distress. She exhibits tenderness.  GI: Soft. She exhibits no mass. There is no hepatomegaly. There is no tenderness.  Genitourinary: No breast swelling, tenderness or discharge.  Musculoskeletal: She exhibits no edema.  Tenderness upon palpation of left costochondral joints, 2-4th.  Lymphadenopathy:    She has no cervical adenopathy.    She has no axillary adenopathy.  Neurological: She is alert and oriented to person, place, and time. She has normal strength. Coordination and gait normal.  Skin: Skin is warm. No rash noted. No erythema.  Psychiatric: She has a normal mood and affect.  Well groomed, good eye contact.     ASSESSMENT AND PLAN:   Diagnoses and all orders for this visit:  Costochondritis  We discussed possible etiologies. Reassured  about serious illness,not likely at this time. Avoid shallow breathing.  Insomnia, unspecified type  Improved. For now no changes in Remeron, which has been well tolerated.We discussed some side effects,including wt gain. Good sleep hygiene.  -     mirtazapine (REMERON) 15 MG tablet; Take 1 tablet (15 mg total) by mouth at bedtime.  Obesity, Class I, BMI 30-34.9  We discussed benefits of wt loss as well as adverse effects of obesity. Her wt has been stable since her first OV.  Consistency with healthy diet and physical activity recommended, neither one she is doing at this time. Weight Watchers is a good option as well as daily brisk walking for 15-30 min as tolerated. We discussed a few pharmacologic options,which we could consider once she has tried life style changes. F/U in 4-5 months.  Moderate episode of recurrent major depressive disorder (HCC)  Improved. I  gave information about psychiatrists' office,so she can call and arrange appt. No changes for now. Instructed about warning signs.  -     FLUoxetine (PROZAC) 10 MG capsule; Take 1 capsule (10 mg total) by mouth daily.  Cellulitis of left breast  Resolved. Keep appt for breast U/S. F/U as needed.    -Ms. Alphonsa Gin Vaneza Pickart was advised to return sooner than planned today if new concerns arise.       Devean Skoczylas G. [place], MD  [facility]. [place] office.

## 2017-03-26 ENCOUNTER — Encounter: Payer: Self-pay | Admitting: Family Medicine

## 2017-03-26 ENCOUNTER — Ambulatory Visit (INDEPENDENT_AMBULATORY_CARE_PROVIDER_SITE_OTHER): Payer: 59 | Admitting: Family Medicine

## 2017-03-26 VITALS — BP 118/70 | HR 86 | Resp 12 | Ht 65.0 in | Wt 208.1 lb

## 2017-03-26 DIAGNOSIS — E66811 Obesity, class 1: Secondary | ICD-10-CM

## 2017-03-26 DIAGNOSIS — G4700 Insomnia, unspecified: Secondary | ICD-10-CM

## 2017-03-26 DIAGNOSIS — N61 Mastitis without abscess: Secondary | ICD-10-CM

## 2017-03-26 DIAGNOSIS — M940 Chondrocostal junction syndrome [Tietze]: Secondary | ICD-10-CM | POA: Diagnosis not present

## 2017-03-26 DIAGNOSIS — F331 Major depressive disorder, recurrent, moderate: Secondary | ICD-10-CM | POA: Diagnosis not present

## 2017-03-26 DIAGNOSIS — E669 Obesity, unspecified: Secondary | ICD-10-CM | POA: Diagnosis not present

## 2017-03-26 MED ORDER — FLUOXETINE HCL 10 MG PO CAPS: 10.0000 mg | ORAL_CAPSULE | Freq: Every day | ORAL | 1 refills | Status: DC

## 2017-03-26 MED ORDER — MIRTAZAPINE 15 MG PO TABS: 15.0000 mg | ORAL_TABLET | Freq: Every day | ORAL | 1 refills | Status: DC

## 2017-03-26 NOTE — Progress Notes (Signed)
Pre visit review using our clinic review tool, if applicable. No additional management support is needed unless otherwise documented below in the visit note. 

## 2017-03-26 NOTE — Patient Instructions (Addendum)
A few things to remember from today's visit:   Insomnia, unspecified type - Plan: mirtazapine (REMERON) 15 MG tablet  BMI 34.0-34.9,adult  Obesity, Class I, BMI 30-34.9  Moderate episode of recurrent major depressive disorder (HCC) - Plan: FLUoxetine (PROZAC) 10 MG capsule  Costochondritis   [facility] Address: [address], [place], [place] [address] Hours: Closed  Opens 9AM Tue Phone: [phone]     What are some tips for weight loss? People become overweight for many reasons. Weight issues can run in families. They can be caused by unhealthy behaviors and a person's environment. Certain health problems and medicines can also lead to weight gain. There are some simple things you can do to reach and maintain a healthy weight:  Eat small more frequent healthy meals instead 3 bid meals. Also Weight Watchers is a good option. Avoid sweet drinks. These include regular soft drinks, fruit juices, fruit drinks, energy drinks, sweetened iced tea, and flavored milk. Avoid fast foods. Fast foods such as french fries, hamburgers, chicken nuggets, and pizza are high in calories and can cause weight gain. Eat a healthy breakfast. People who skip breakfast tend to weigh more. Don't watch more than two hours of television per day. Chew sugar-free gum between meals to cut down on snacking. Avoid grocery shopping when you're hungry. Pack a healthy lunch instead of eating out to control what and how much you eat. Eat a lot of fruits and vegetables. Aim for about 2 cups of fruit and 2 to 3 cups of vegetables per day. Aim for 150 minutes per week of moderate-intensity exercise (such as brisk walking), or 75 minutes per week of vigorous exercise (such as jogging or running). OR 15-30 min of daily brisk walking. Be more active. Small changes in physical activity can easily be added to your daily routine. For example, take the stairs instead of the elevator. Take a walk with your  family. A daily walk is a great way to get exercise and to catch up on the day's events.   Please be sure medication list is accurate. If a new problem present, please set up appointment sooner than planned today.

## 2017-03-29 IMAGING — CR DG LUMBAR SPINE 2-3V
3 series · 3 of 3 positions shown · non-contrast
Comparison: No recent prior.

CLINICAL DATA: Low back pain.

EXAM:
LUMBAR SPINE - 2-3 VIEW

[w lumbar spine ap]
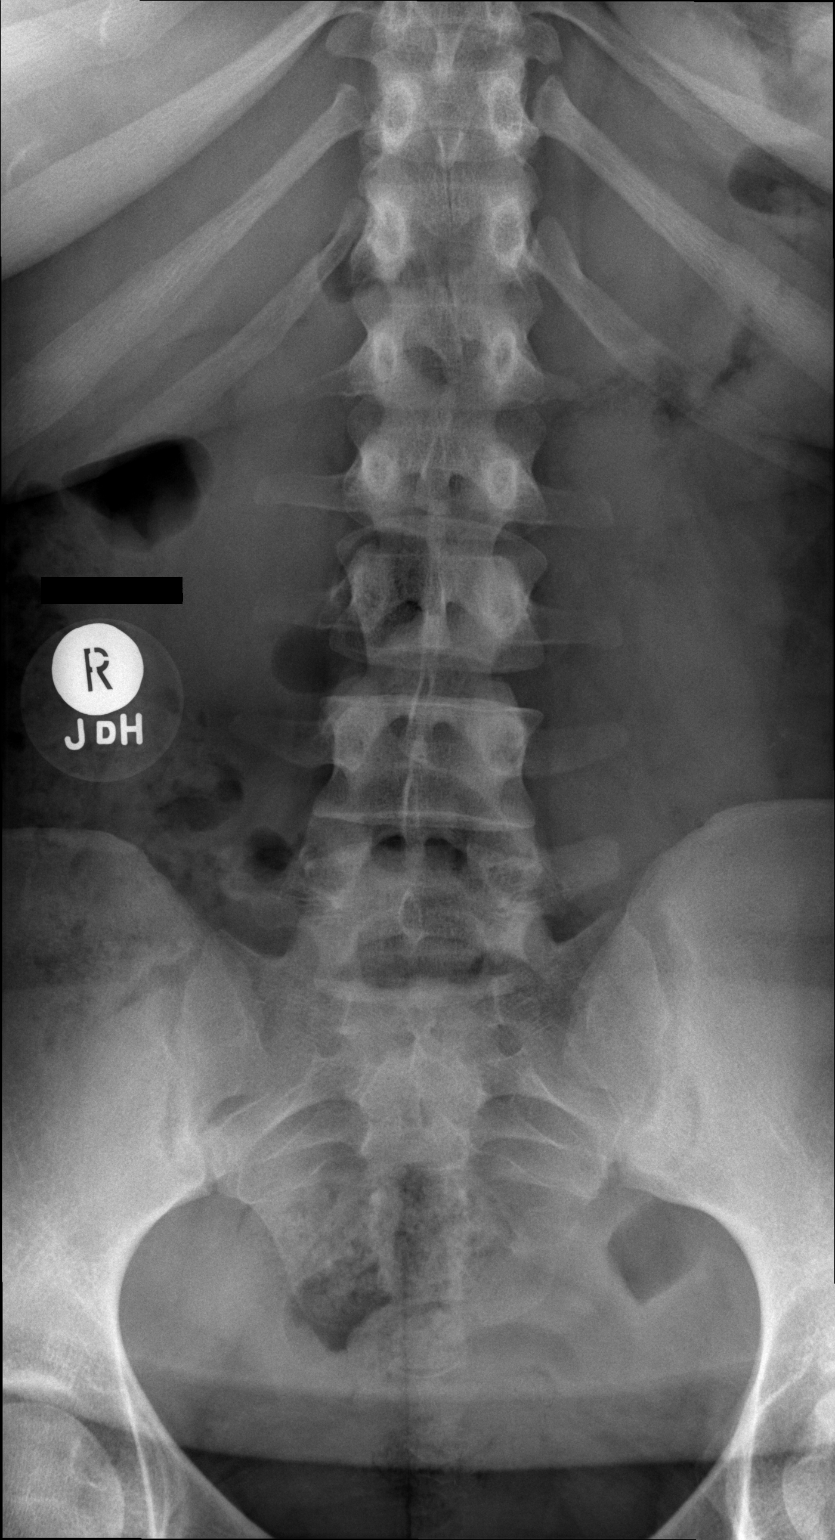

[w lumbar spine lat]
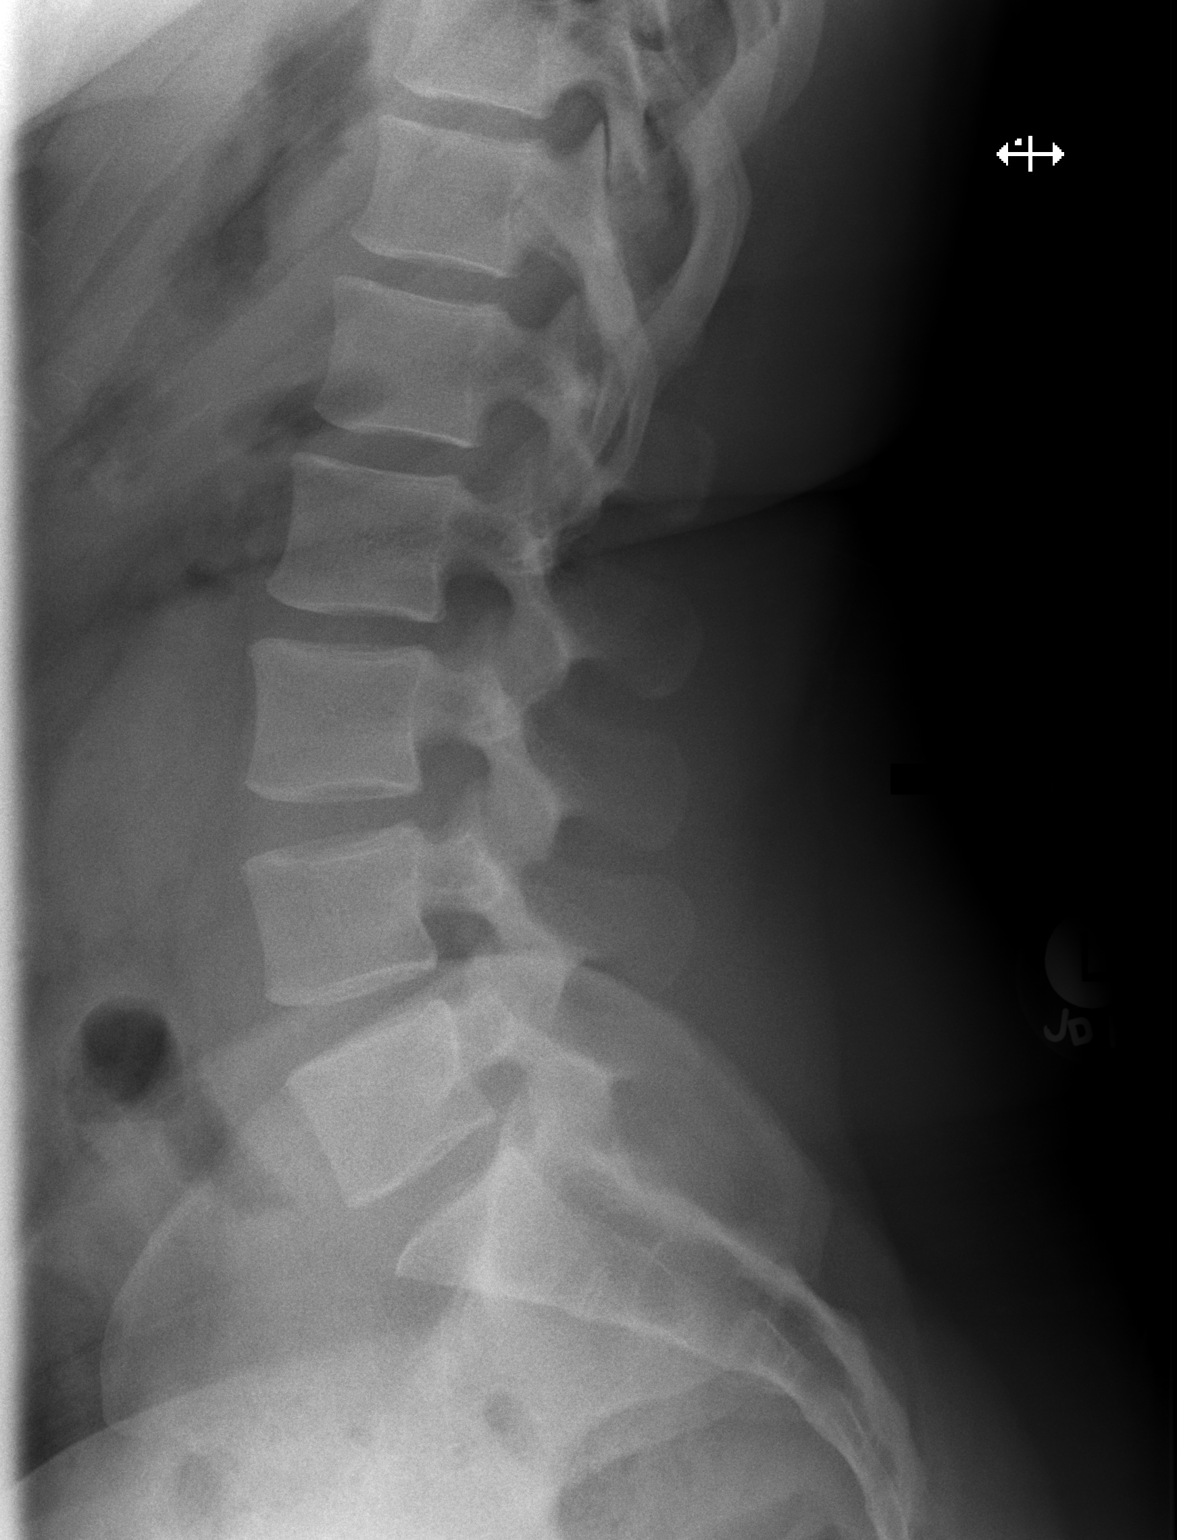

[w lumbar l-5 s-1 spot]
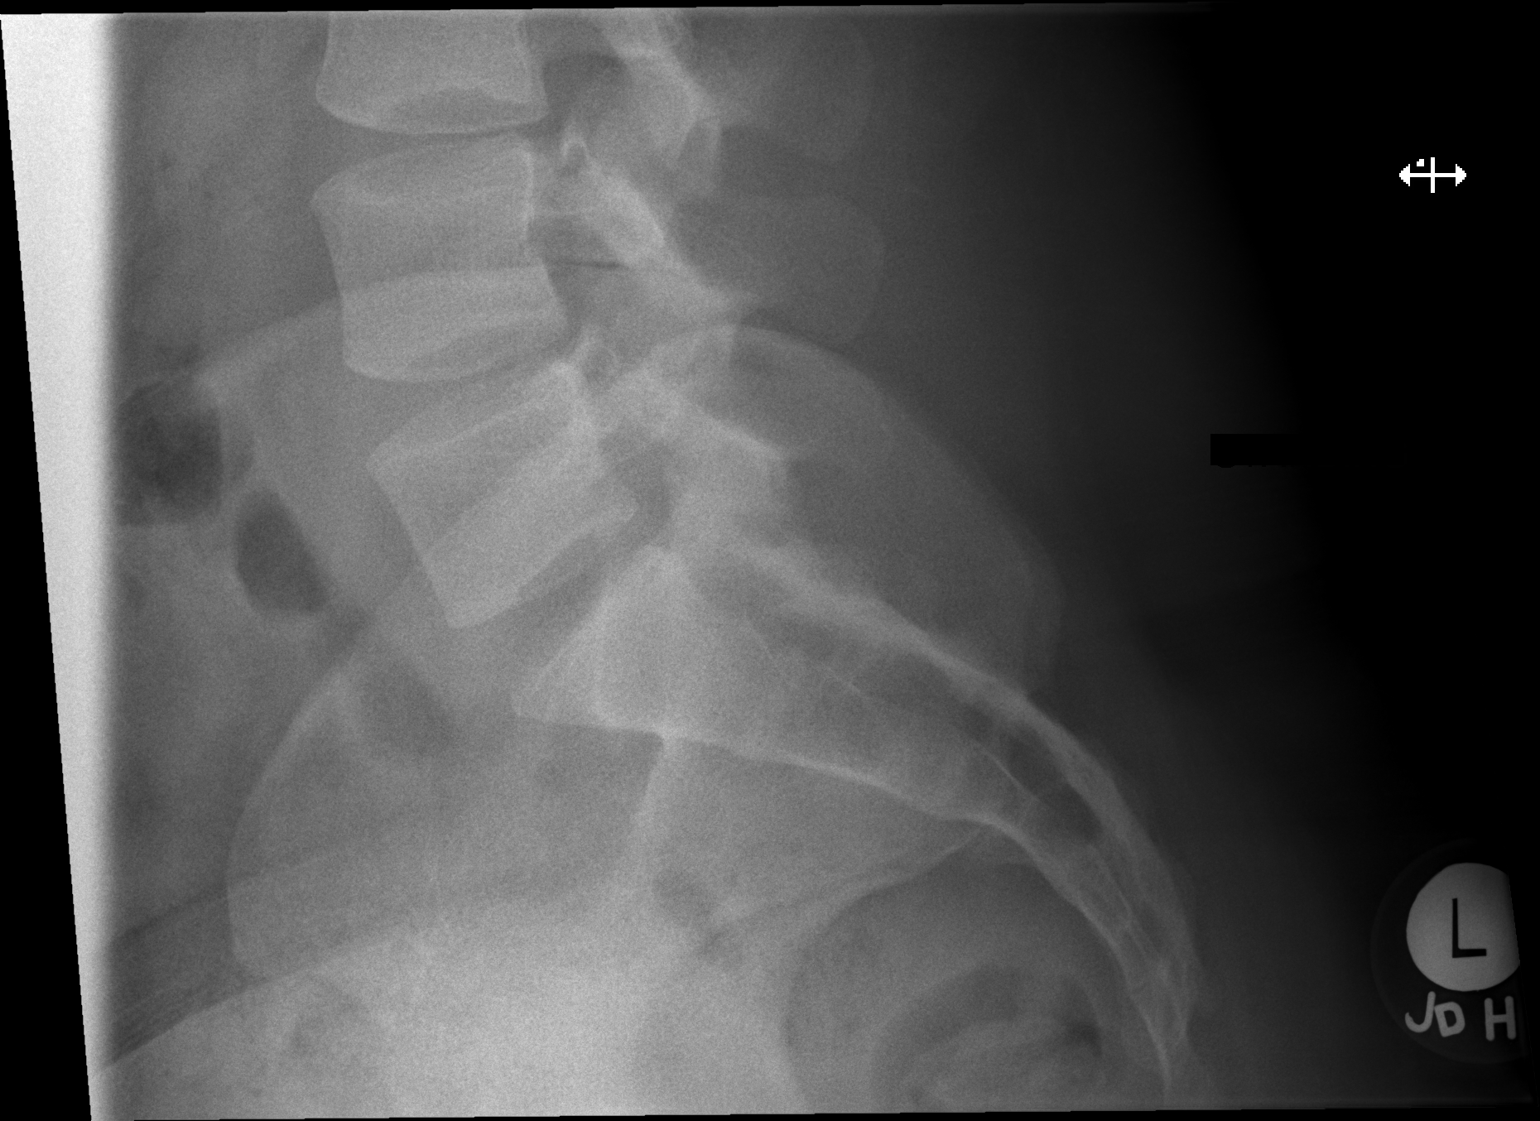

[3 of 3 positions shown; findings below may reference images not displayed]

FINDINGS: No acute bony or joint abnormality identified . No evidence of
fracture. Normal alignment and mineralization.
IMPRESSION: No acute bony abnormality.

## 2017-04-02 ENCOUNTER — Other Ambulatory Visit: Payer: 59

## 2017-04-09 ENCOUNTER — Other Ambulatory Visit: Payer: Self-pay | Admitting: Family Medicine

## 2017-04-09 DIAGNOSIS — G4700 Insomnia, unspecified: Secondary | ICD-10-CM

## 2017-05-05 ENCOUNTER — Other Ambulatory Visit: Payer: Self-pay | Admitting: Family Medicine

## 2017-05-05 DIAGNOSIS — F331 Major depressive disorder, recurrent, moderate: Secondary | ICD-10-CM

## 2017-06-01 ENCOUNTER — Other Ambulatory Visit: Payer: Self-pay | Admitting: Family Medicine

## 2017-06-01 DIAGNOSIS — G4700 Insomnia, unspecified: Secondary | ICD-10-CM

## 2017-06-12 ENCOUNTER — Encounter (HOSPITAL_COMMUNITY): Payer: Self-pay | Admitting: Emergency Medicine

## 2017-06-12 ENCOUNTER — Emergency Department (HOSPITAL_COMMUNITY)
Admission: EM | Admit: 2017-06-12 | Discharge: 2017-06-12 | Disposition: A | Discharge status: Home / Self Care | Payer: 59 | Attending: Emergency Medicine | Admitting: Emergency Medicine

## 2017-06-12 DIAGNOSIS — N898 Other specified noninflammatory disorders of vagina: Secondary | ICD-10-CM | POA: Diagnosis present

## 2017-06-12 DIAGNOSIS — Z87891 Personal history of nicotine dependence: Secondary | ICD-10-CM | POA: Insufficient documentation

## 2017-06-12 DIAGNOSIS — L298 Other pruritus: Secondary | ICD-10-CM | POA: Diagnosis not present

## 2017-06-12 LAB — WET PREP, GENITAL
Clue Cells Wet Prep HPF POC: NONE SEEN
SPERM: NONE SEEN
TRICH WET PREP: NONE SEEN
YEAST WET PREP: NONE SEEN

## 2017-06-12 LAB — URINALYSIS, ROUTINE W REFLEX MICROSCOPIC
BILIRUBIN URINE: NEGATIVE
GLUCOSE, UA: NEGATIVE mg/dL
Hgb urine dipstick: NEGATIVE
Ketones, ur: NEGATIVE mg/dL
Nitrite: NEGATIVE
PH: 5 (ref 5.0–8.0)
Protein, ur: NEGATIVE mg/dL
SPECIFIC GRAVITY, URINE: 1.027 (ref 1.005–1.030)

## 2017-06-12 LAB — POC URINE PREG, ED: Preg Test, Ur: NEGATIVE

## 2017-06-12 MED ORDER — CEFTRIAXONE SODIUM 250 MG IJ SOLR
250.0000 mg | Freq: Once | INTRAMUSCULAR | Status: AC
  Administered 2017-06-12: 250 mg via INTRAMUSCULAR
  Filled 2017-06-12: qty 250

## 2017-06-12 MED ORDER — AZITHROMYCIN 250 MG PO TABS
1000.0000 mg | ORAL_TABLET | Freq: Once | ORAL | Status: AC
  Administered 2017-06-12: 1000 mg via ORAL
  Filled 2017-06-12: qty 4

## 2017-06-12 MED ORDER — STERILE WATER FOR INJECTION IJ SOLN
INTRAMUSCULAR | Status: AC
  Administered 2017-06-12: 10 mL
  Filled 2017-06-12: qty 10

## 2017-06-12 NOTE — Discharge Instructions (Signed)
Please follow with your primary care doctor in the next 2 days for a check-up. They must obtain records for further management.  ° °Do not hesitate to return to the Emergency Department for any new, worsening or concerning symptoms.  ° °

## 2017-06-12 NOTE — ED Provider Notes (Signed)
[facility]-EMERGENCY DEPT Provider Note   CSN: [id number] Arrival date & time: 06/12/17  1316     History   Chief Complaint Chief Complaint  Patient presents with  . Vaginal Discharge    HPI   Blood pressure 112/70, pulse 76, temperature 98.4 F (36.9 C), temperature source Oral, resp. rate 14, height 5\' 5"  (1.651 m), weight 91.6 kg (202 lb), SpO2 100 %, unknown if currently breastfeeding.  Dawn Barnett is a 26 y.o. female complaining of Profuse whitish foul-smelling vaginal discharge onset 4 days ago with associated severe itching onset in the last day she denies any fever, chills, nausea vomiting or abdominal pain. She was unable to get an appointment with her OB/GYN in the next week so she (presented to the emergency department. She states that she hasn't had unprotected sex in the last month and her last STD screen was 2 months ago. She states that she is prone to bacterial vaginosis.She denies any dysuria, urinary frequency or sensation of incomplete void   Past Medical History:  Diagnosis Date  . Abnormal Pap smear 01/2009  . BV (bacterial vaginosis) 2010  . Dysplasia 2010  . H/O cervicitis 2010  . H/O gonorrhea 2010  . H/O varicella   . History of chlamydia 06/05/09  . HPV in female   . Irregular periods/menstrual cycles   . Itching 09/04/2009  . Pelvic pain in female 2010  . Syphilis 2006  . Weight loss     Patient Active Problem List   Diagnosis Date Noted  . Obesity, Class I, BMI 30-34.9 03/26/2017  . Major depression, recurrent (HCC) 02/12/2017  . BMI 34.0-34.9,adult 02/12/2017  . Insomnia 02/12/2017  . Vaginal delivery 04/23/2014  . Gonorrhea complicating pregnancy 09/12/2013  . Smoker 09/12/2013  . Hyperemesis arising during pregnancy 09/12/2013  . Hx of syphilis 09/12/2013  . 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 Hx of chlamydia infection 09/12/2013    History reviewed. No pertinent surgical history.  OB History    Gravida Para Term Preterm AB Living   2 2  2     2    SAB TAB Ectopic Multiple Live Births           2       Home Medications    Prior to Admission medications   Medication Sig Start Date End Date Taking? Authorizing Provider  etonogestrel (NEXPLANON) 68 MG IMPL implant 1 each by Subdermal route once.    [provider]  FLUoxetine (PROZAC) 10 MG capsule Take 1 capsule (10 mg total) by mouth daily. 03/26/17   [place], Betty G, MD  FLUoxetine (PROZAC) 10 MG capsule TAKE 1 CAPSULE BY MOUTH DAILY. 05/05/17   [place], Betty G, MD  mirtazapine (REMERON) 15 MG tablet Take 1 tablet (15 mg total) by mouth at bedtime. 03/26/17   [place], Betty G, MD  mirtazapine (REMERON) 15 MG tablet TAKE 1 TABLET (15 MG TOTAL) BY MOUTH AT BEDTIME. 06/01/17   [place], Betty G, MD    Family History Family History  Problem Relation Age of Onset  . Drug abuse Father   . Alcohol abuse Father   . COPD Maternal Grandmother        Emphysema    Social History Social History  Substance Use Topics  . Smoking status: Former Smoker    Packs/day: 0.25    Quit date: 03/21/2012  . Smokeless tobacco: Never Used  . Alcohol use No     Allergies   Patient has no known allergies.  Review of Systems Review of Systems  A complete review of systems was obtained and all systems are negative except as noted in the HPI and PMH.   Physical Exam Updated Vital Signs BP 112/70 (BP Location: Right Arm)   Pulse 76   Temp 98.4 F (36.9 C) (Oral)   Resp 14   Ht 5\' 5"  (1.651 m)   Wt 91.6 kg (202 lb)   SpO2 100%   BMI 33.61 kg/m   Physical Exam  Constitutional: She is oriented to person, place, and time. She appears well-developed and well-nourished. No distress.  HENT:  Head: Normocephalic and atraumatic.  Mouth/Throat: Oropharynx is clear and moist.  Eyes: Conjunctivae and EOM are normal. Pupils are equal, round, and reactive to light.  Neck: Normal range of motion.  Cardiovascular: Normal rate, regular rhythm and intact distal pulses.     Pulmonary/Chest: Effort normal and breath sounds normal. No stridor.  Abdominal: Soft. Bowel sounds are normal. She exhibits no distension and no mass. There is no tenderness. There is no rebound and no guarding.  Genitourinary:  Genitourinary Comments: Pelvic exam is chaperoned by technician: No rashes or lesions, there is a profuse, foul-smelling vaginal discharge with no cervical motion or adnexal tenderness.  No rashes or lesions  Musculoskeletal: Normal range of motion.  Neurological: She is alert and oriented to person, place, and time.  Skin: She is not diaphoretic.  Psychiatric: She has a normal mood and affect.  Nursing note and vitals reviewed.    ED Treatments / Results  Labs (all labs ordered are listed, but only abnormal results are displayed) Labs Reviewed  WET PREP, GENITAL - Abnormal; Notable for the following:       Result Value   WBC, Wet Prep HPF POC MANY (*)    All other components within normal limits  URINALYSIS, ROUTINE W REFLEX MICROSCOPIC - Abnormal; Notable for the following:    APPearance CLOUDY (*)    Leukocytes, UA MODERATE (*)    Bacteria, UA MANY (*)    Squamous Epithelial / LPF 6-30 (*)    All other components within normal limits  RPR  HIV ANTIBODY (ROUTINE TESTING)  POC URINE PREG, ED  GC/CHLAMYDIA PROBE AMP ([facility]ees Toh) NOT AT [facility]    EKG  EKG Interpretation None       Radiology No results found.  Procedures Procedures (including critical care time)  Medications Ordered in ED Medications  cefTRIAXone (ROCEPHIN) injection 250 mg (250 mg Intramuscular Given 06/12/17 1756)  azithromycin (ZITHROMAX) tablet 1,000 mg (1,000 mg Oral Given 06/12/17 1754)  sterile water (preservative free) injection (10 mLs  Given 06/12/17 1755)     Initial Impression / Assessment and Plan / ED Course  I have reviewed the triage vital signs and the nursing notes.  Pertinent labs & imaging results that were available during my care of the patient were  reviewed by me and considered in my medical decision making (see chart for details).     Vitals:   06/12/17 1345 06/12/17 1620  BP: 109/81 112/70  Pulse: 87 76  Resp: 12 14  Temp: 98.4 F (36.9 C)   TempSrc: Oral   SpO2: 100% 100%  Weight: 91.6 kg (202 lb)   Height: 5\' 5"  (1.651 m)     Medications  cefTRIAXone (ROCEPHIN) injection 250 mg (250 mg Intramuscular Given 06/12/17 1756)  azithromycin (ZITHROMAX) tablet 1,000 mg (1,000 mg Oral Given 06/12/17 1754)  sterile water (preservative free) injection (10 mLs  Given 06/12/17 1755)  Alphonsa GinKemiyah Jerold CoombeFransay Halbleib is 26 y.o. female presenting with Profuse vaginal discharge and vaginal itching.Surprisingly, this patient's wet prep is negative for trichomoniasis, BV and yeast. We'll treat for cervicitis. She has no signs of PID. I've advised her to follow closely with OB/GYN for recheck in 10 days.  Evaluation does not show pathology that would require ongoing emergent intervention or inpatient treatment. Pt is hemodynamically stable and mentating appropriately. Discussed findings and plan with patient/guardian, who agrees with care plan. All questions answered. Return precautions discussed and outpatient follow up given.      Final Clinical Impressions(s) / ED Diagnoses   Final diagnoses:  Vaginal discharge  Vagina itching    New Prescriptions New Prescriptions   No medications on file     Kaylyn Limisciotta, Andreina Outten, PA-C 06/12/17 Warrick [id number]    Schlossman, Erin, MD 06/13/17 [phone]

## 2017-06-12 NOTE — ED Triage Notes (Signed)
Pt of vaginal discharge, itching, odor, spotting since Tuesday. Has Implanon in arm for 3 years.

## 2017-06-13 LAB — RPR: RPR Ser Ql: NONREACTIVE

## 2017-06-13 LAB — HIV ANTIBODY (ROUTINE TESTING W REFLEX): HIV Screen 4th Generation wRfx: NONREACTIVE

## 2017-06-15 LAB — GC/CHLAMYDIA PROBE AMP (~~LOC~~) NOT AT ARMC
CHLAMYDIA, DNA PROBE: NEGATIVE
NEISSERIA GONORRHEA: NEGATIVE

## 2017-06-26 ENCOUNTER — Ambulatory Visit: Payer: 59 | Admitting: Family Medicine

## 2017-06-29 ENCOUNTER — Ambulatory Visit: Payer: Self-pay | Admitting: Family Medicine

## 2017-06-29 DIAGNOSIS — Z0289 Encounter for other administrative examinations: Secondary | ICD-10-CM

## 2017-07-01 ENCOUNTER — Ambulatory Visit (INDEPENDENT_AMBULATORY_CARE_PROVIDER_SITE_OTHER): Payer: 59 | Admitting: Family Medicine

## 2017-07-01 ENCOUNTER — Encounter: Payer: Self-pay | Admitting: Family Medicine

## 2017-07-01 VITALS — BP 120/72 | HR 78 | Resp 12 | Ht 65.0 in | Wt 215.2 lb

## 2017-07-01 DIAGNOSIS — R51 Headache: Secondary | ICD-10-CM

## 2017-07-01 DIAGNOSIS — F331 Major depressive disorder, recurrent, moderate: Secondary | ICD-10-CM | POA: Diagnosis not present

## 2017-07-01 DIAGNOSIS — G4700 Insomnia, unspecified: Secondary | ICD-10-CM | POA: Diagnosis not present

## 2017-07-01 DIAGNOSIS — R519 Headache, unspecified: Secondary | ICD-10-CM

## 2017-07-01 MED ORDER — MIRTAZAPINE 15 MG PO TABS: 15.0000 mg | ORAL_TABLET | Freq: Every day | ORAL | 1 refills | Status: DC

## 2017-07-01 MED ORDER — FLUOXETINE HCL 20 MG PO TABS: 20.0000 mg | ORAL_TABLET | Freq: Every day | ORAL | 1 refills | Status: DC

## 2017-07-01 NOTE — Progress Notes (Signed)
ACUTE VISIT   HPI:  Chief Complaint  Patient presents with  . Depression    Dawn Barnett is a 26 y.o. female, who is here today complaining of worsening anxiety and depression, attributed to the date of her father. He died as a result of complications from alcohol abuse,cirrhosis, and hepatitis C.  Intermittent blurry vision for the past 2 weeks, she denies conjunctival erythema or other associated symptoms. She denies eye trauma.  A month of frontal, mildly sharp/dull headache, intermittently. No associated with nausea, vomiting, or photophobia. She denies history of chronic headaches.  Depression       The patient presents with depression.  This is a chronic problem.  The current episode started more than 1 year ago.   The onset quality is gradual.   The problem occurs constantly.  The problem has been gradually worsening since onset.  Associated symptoms include decreased concentration, fatigue, helplessness, hopelessness, insomnia, decreased interest, appetite change, headaches and sad.  Associated symptoms include no body aches, no myalgias, no indigestion and no suicidal ideas.     The symptoms are aggravated by family issues.  Past treatments include SSRIs - Selective serotonin reuptake inhibitors.  Compliance with treatment is variable.  Previous treatment provided mild relief.  Risk factors include family history, family history of mental illness, major life event, stress and the patient not taking medications correctly.   Past medical history includes anxiety and depression.     Pertinent negatives include no recent illness and no suicide attempts.   I saw her last on 03/26/2017 for ER follow-up. During this visit she reported improvement of sleep, depressed mood, as well as anxiety with medications. Established care on 02/12/2017, when she was started on fluoxetine 10 mg and Remeron 50 mg daily, she started medication about 3 weeks after visit. She is not taking  medication daily.  She missed follow-up appointments and she has not scheduled appointment with psychiatrist as recommended.  She states that Remeron is not helping with her sleep, it is taking her about 3 hours to fall asleep, 4 hours later she is waking up and cannot go back to sleep. She is not eating during the day but rather right before going to bed.  She is also requesting FMLA to be out of work for anxiety and depression.  She is still living with her 2 daughters, 373-59 years old, and her brother.   Review of Systems  Constitutional: Positive for appetite change and fatigue. Negative for fever.  HENT: Negative for mouth sores, nosebleeds, sore throat and trouble swallowing.   Eyes: Positive for visual disturbance (occasional blurry vision). Negative for photophobia, pain and redness.  Respiratory: Negative for chest tightness, shortness of breath and wheezing.   Cardiovascular: Negative for palpitations and leg swelling.  Gastrointestinal: Negative for abdominal pain, nausea and vomiting.       No changes in bowel habits.  Genitourinary: Negative for decreased urine volume and hematuria.  Musculoskeletal: Negative for gait problem and myalgias.  Skin: Negative for rash.  Allergic/Immunologic: Positive for environmental allergies.  Neurological: Positive for headaches. Negative for syncope, facial asymmetry, speech difficulty, weakness and numbness.  Psychiatric/Behavioral: Positive for decreased concentration, depression and sleep disturbance. Negative for hallucinations and suicidal ideas. The patient is nervous/anxious and has insomnia.       Current Outpatient Prescriptions on File Prior to Visit  Medication Sig Dispense Refill  . etonogestrel (NEXPLANON) 68 MG IMPL implant 1 each by Subdermal route once.  No current facility-administered medications on file prior to visit.      Past Medical History:  Diagnosis Date  . Abnormal Pap smear 01/2009  . BV (bacterial  vaginosis) 2010  . Dysplasia 2010  . H/O cervicitis 2010  . H/O gonorrhea 2010  . H/O varicella   . History of chlamydia 06/05/09  . HPV in female   . Irregular periods/menstrual cycles   . Itching 09/04/2009  . Pelvic pain in female 2010  . Syphilis 2006  . Weight loss    No Known Allergies  Social History   Social History  . Marital status: Single    Spouse name: N/A  . Number of children: N/A  . Years of education: N/A   Social History Main Topics  . Smoking status: Former Smoker    Packs/day: 0.25    Quit date: 03/21/2012  . Smokeless tobacco: Never Used  . Alcohol use No  . Drug use: No     Comment: 4 weeks ago  . Sexual activity: Yes    Birth control/ protection: None   Other Topics Concern  . None   Social History Narrative  . None    Vitals:   07/01/17 1159  BP: 120/72  Pulse: 78  Resp: 12  O2 sat at RA 98% Body mass index is 35.82 kg/m.   Physical Exam  Nursing note and vitals reviewed. Constitutional: She is oriented to person, place, and time. She appears well-developed. No distress.  HENT:  Mouth/Throat: Oropharynx is clear and moist and mucous membranes are normal.  Eyes: Pupils are equal, round, and reactive to light. Conjunctivae and EOM are normal.  Cardiovascular: Normal rate and regular rhythm.   No murmur heard. Respiratory: Effort normal and breath sounds normal. No respiratory distress.  Musculoskeletal: She exhibits no edema.  Lymphadenopathy:    She has no cervical adenopathy.  Neurological: She is alert and oriented to person, place, and time. She has normal strength. No cranial nerve deficit. Coordination and gait normal.  Reflex Scores:      Bicep reflexes are 2+ on the right side and 2+ on the left side.      Patellar reflexes are 2+ on the right side and 2+ on the left side. Skin: Skin is warm. No erythema.  Psychiatric: Cognition and memory are normal. She exhibits a depressed mood. She expresses no suicidal ideation. She  expresses no suicidal plans.  Appropriately groomed, otherwise good eye contact.     ASSESSMENT AND PLAN:   [name] was seen today for depression.  Diagnoses and all orders for this visit:  Frontal headache  Neurologic examination today is negative. Instructed to monitor for warning signs. ? Allergies, lack of sleep,tension headache among some to consider. Vision screening was not done today, she was gone already. I did recommend scheduling eye exam with her eye care provider during visit. F/U in 3-4 weeks.  Insomnia, unspecified type  Recommend taking Remeron daily at night, some side effects discussed. Good sleep hygiene.  -     mirtazapine (REMERON) 15 MG tablet; Take 1 tablet (15 mg total) by mouth at bedtime.  Moderate episode of recurrent major depressive disorder (HCC)  Denies suicidal thoughts or plan. Fluoxetine increased from 10 mg to 20 mg. Instructed about warning signs. Strongly recommend following with psychiatrist, she received a list of providers with phone numbers. She voices understanding.  -     FLUoxetine (PROZAC) 20 MG tablet; Take 1 tablet (20 mg total) by mouth daily. -  Ambulatory referral to Psychiatry    Return in about 3 weeks (around 07/22/2017) for depression.     Dennard Vezina G. [place], MD  [facility]. [place] office.

## 2017-07-01 NOTE — Patient Instructions (Addendum)
A few things to remember from today's visit:   Moderate episode of recurrent major depressive disorder (HCC) - Plan: FLUoxetine (PROZAC) 20 MG tablet, Ambulatory referral to Psychiatry  Insomnia, unspecified type - Plan: mirtazapine (REMERON) 15 MG tablet  Frontal headache  Fluoxetine increased from 10 mg to 20 mg , Remeron at night unchanged.   Today we increased dose of Fluoxetine, this type of medications can increase suicidal risk. This is more prevalent among children,adolecents, and young adults with major depression or other psychiatric disorders. It can also make depression worse. Most common side effects are gastrointestinal, self limited after a few weeks: diarrhea, nausea, constipation  Or diarrhea among some.  In general it is well tolerated. We will follow closely.      Please be sure medication list is accurate. If a new problem present, please set up appointment sooner than planned today.

## 2017-07-22 ENCOUNTER — Ambulatory Visit: Payer: 59 | Admitting: Family Medicine

## 2017-09-03 ENCOUNTER — Encounter: Payer: Self-pay | Admitting: Family Medicine

## 2017-09-23 ENCOUNTER — Other Ambulatory Visit: Payer: Self-pay | Admitting: Family Medicine

## 2017-09-23 DIAGNOSIS — G4700 Insomnia, unspecified: Secondary | ICD-10-CM

## 2017-10-20 ENCOUNTER — Other Ambulatory Visit: Payer: Self-pay | Admitting: Family Medicine

## 2017-10-20 DIAGNOSIS — G4700 Insomnia, unspecified: Secondary | ICD-10-CM

## 2017-10-26 ENCOUNTER — Other Ambulatory Visit: Payer: Self-pay | Admitting: Family Medicine

## 2017-10-26 DIAGNOSIS — G4700 Insomnia, unspecified: Secondary | ICD-10-CM

## 2017-10-30 ENCOUNTER — Other Ambulatory Visit: Payer: Self-pay | Admitting: *Deleted

## 2017-10-30 DIAGNOSIS — G4700 Insomnia, unspecified: Secondary | ICD-10-CM

## 2017-10-30 MED ORDER — MIRTAZAPINE 15 MG PO TABS: 15.0000 mg | ORAL_TABLET | Freq: Every day | ORAL | 0 refills | Status: DC

## 2017-12-05 IMAGING — CR DG CHEST 2V
2 series · 2 of 2 positions shown · non-contrast
Comparison: 12/29/2016

CLINICAL DATA: Central and left-sided chest pain over the last 4
days.

EXAM:
CHEST  2 VIEW

[w chest pa]
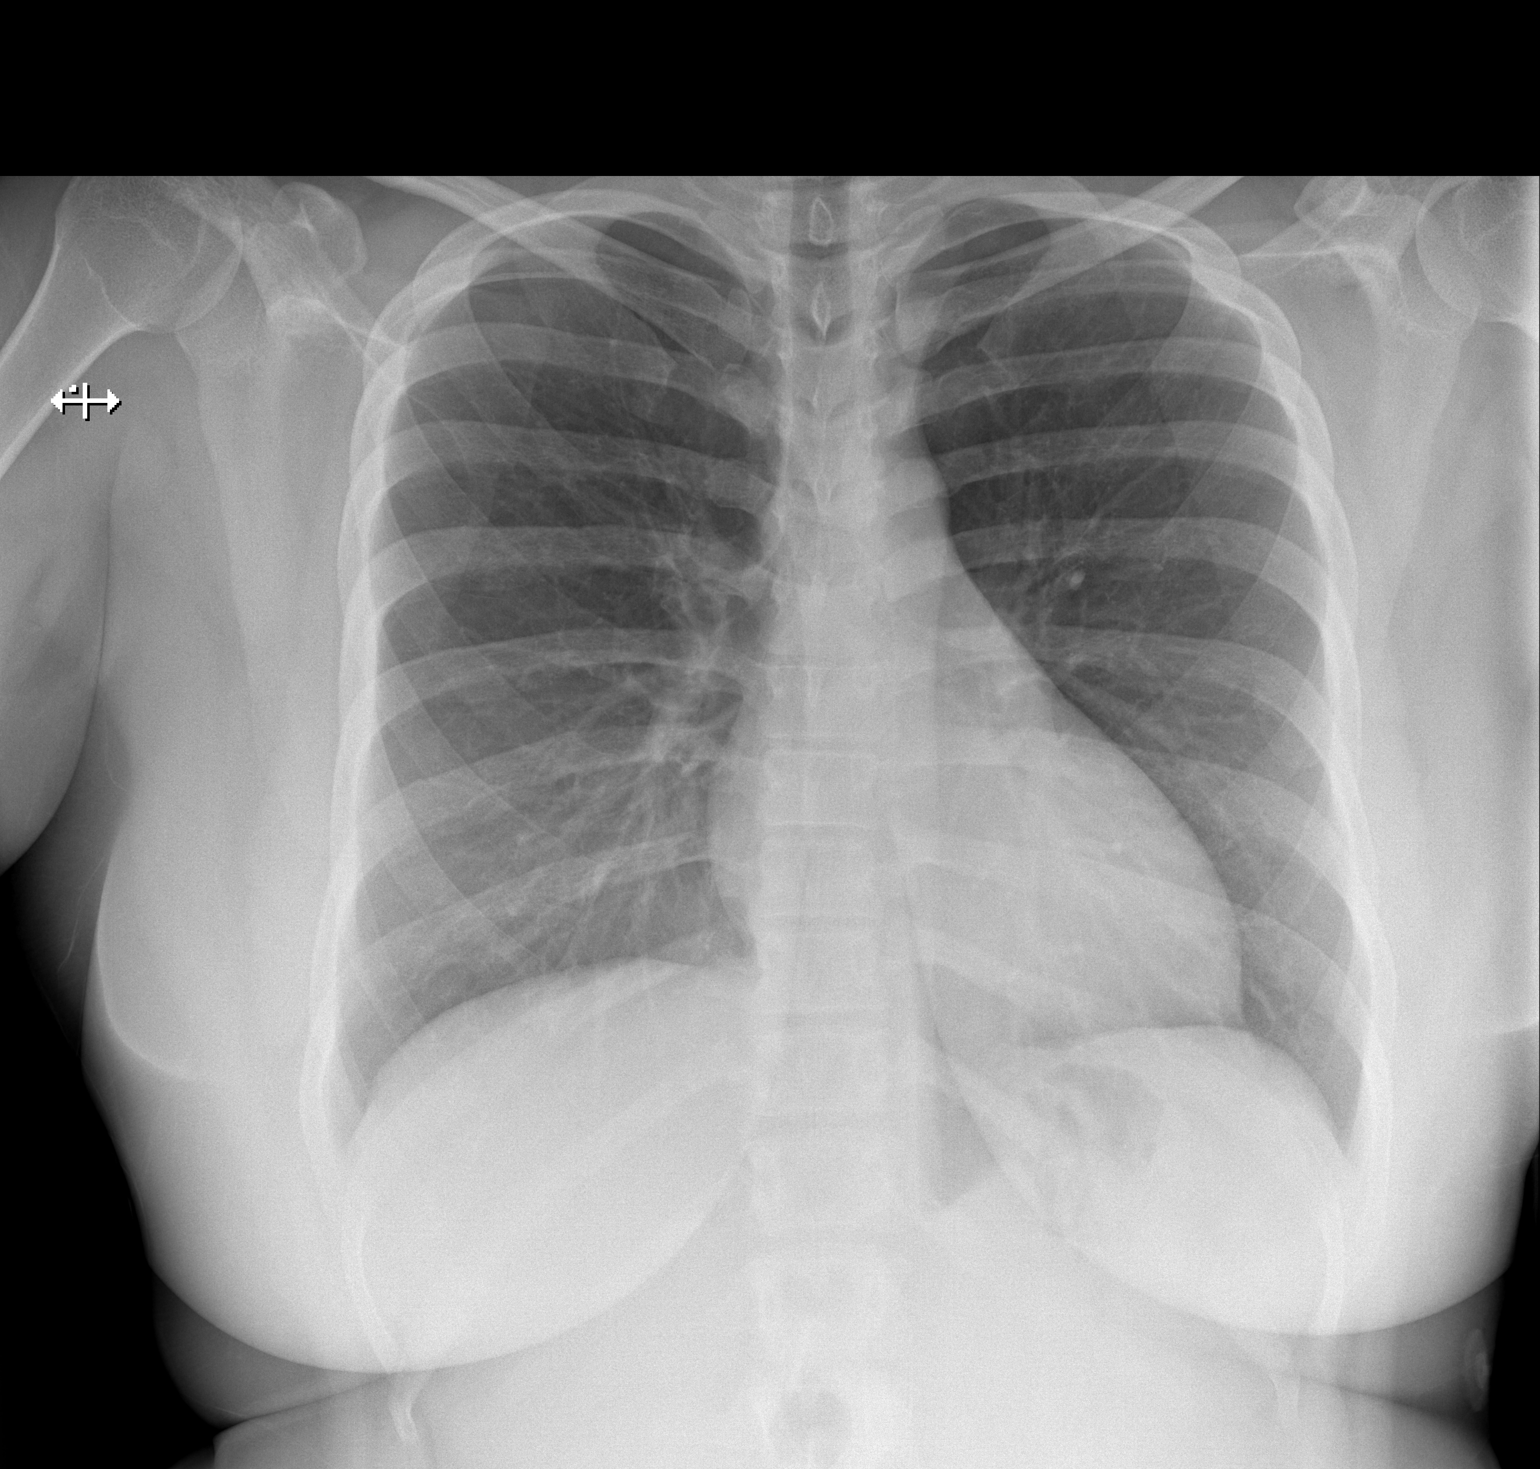

[w chest lat]
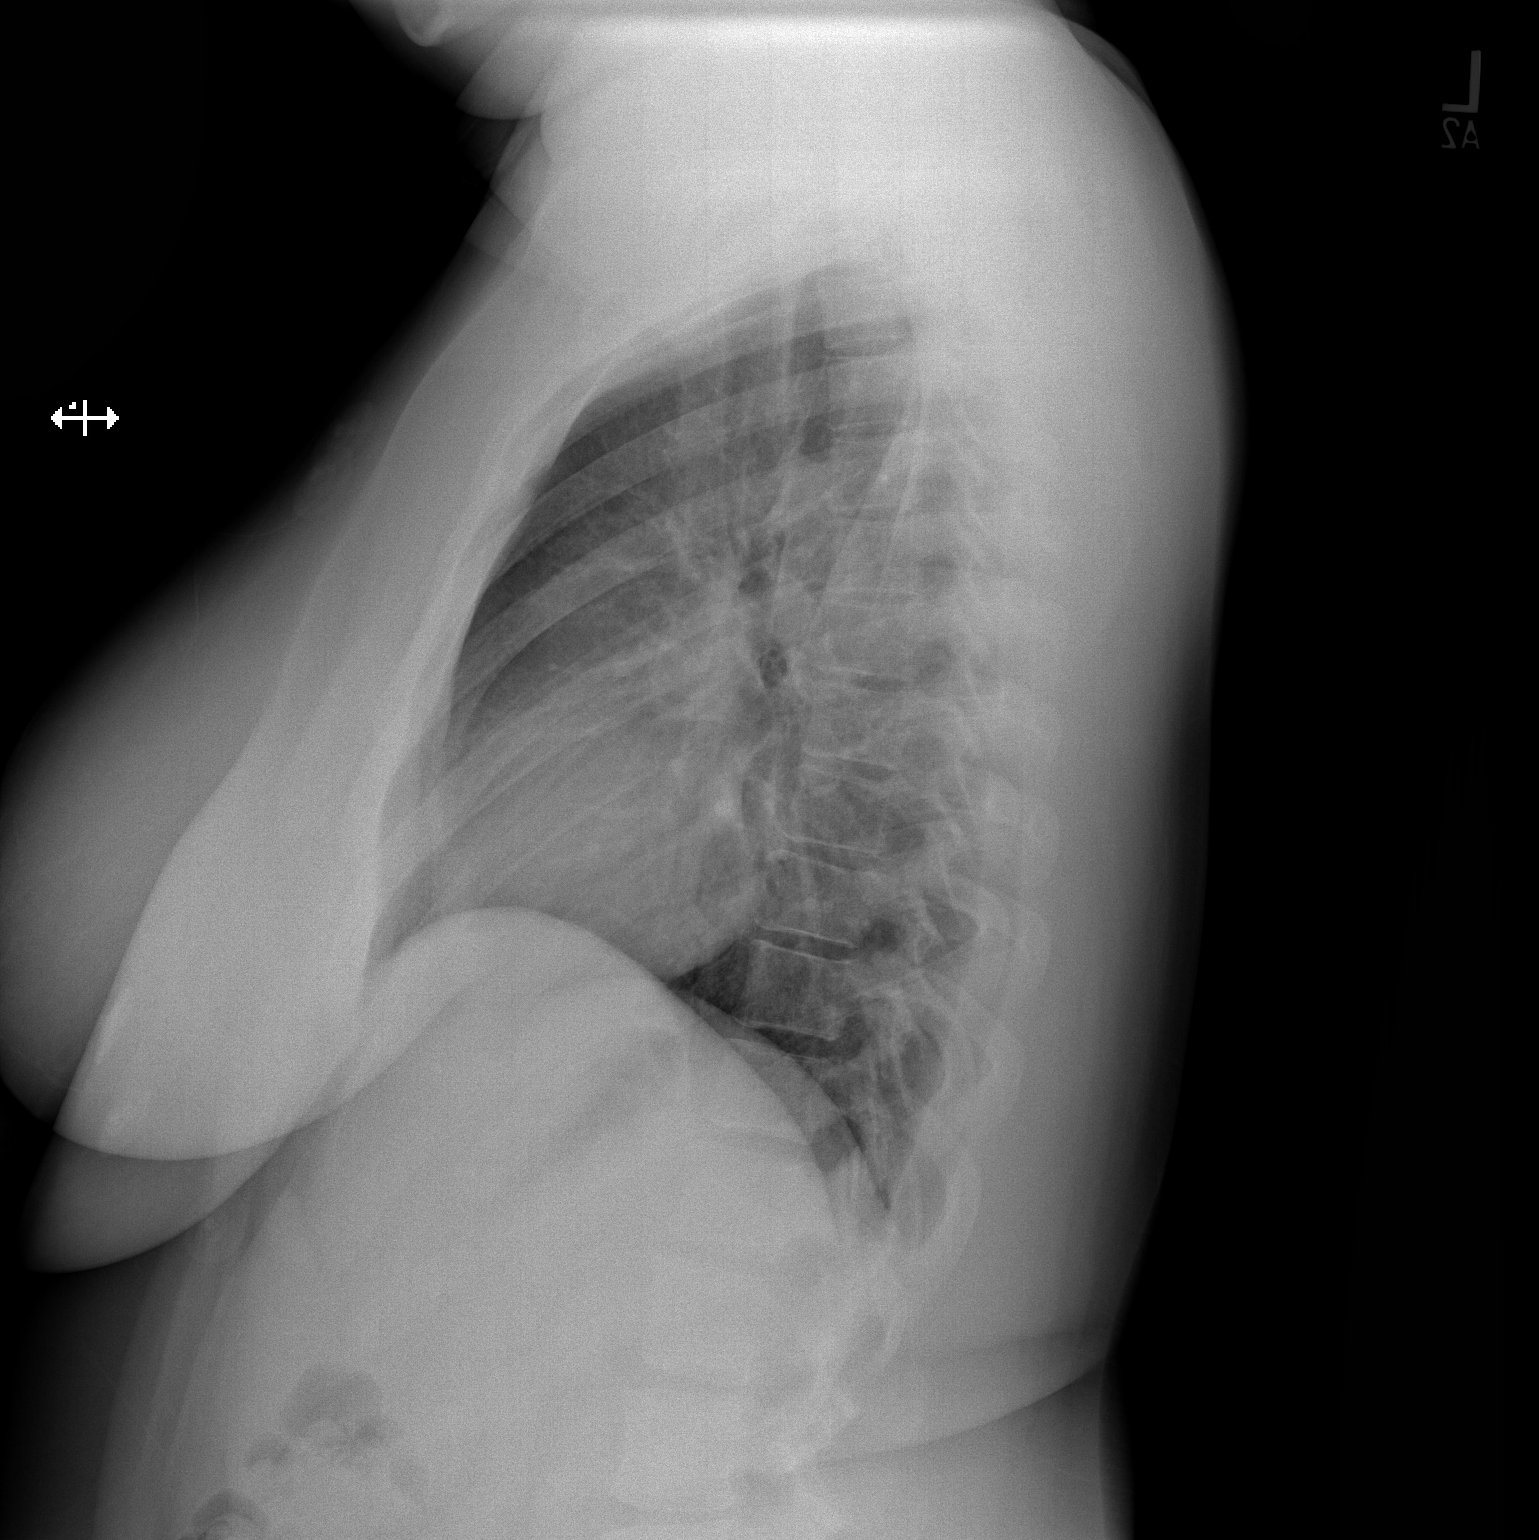

[2 of 2 positions shown; findings below may reference images not displayed]

FINDINGS: Normal chest
IMPRESSION: Normal

## 2017-12-07 ENCOUNTER — Other Ambulatory Visit: Payer: Self-pay | Admitting: Family Medicine

## 2017-12-07 DIAGNOSIS — G4700 Insomnia, unspecified: Secondary | ICD-10-CM

## 2018-07-17 DIAGNOSIS — N898 Other specified noninflammatory disorders of vagina: Secondary | ICD-10-CM | POA: Diagnosis not present

## 2018-07-17 DIAGNOSIS — N760 Acute vaginitis: Secondary | ICD-10-CM | POA: Diagnosis not present

## 2018-07-17 DIAGNOSIS — L989 Disorder of the skin and subcutaneous tissue, unspecified: Secondary | ICD-10-CM | POA: Diagnosis not present

## 2018-07-17 DIAGNOSIS — B9689 Other specified bacterial agents as the cause of diseases classified elsewhere: Secondary | ICD-10-CM | POA: Diagnosis not present

## 2018-07-28 DIAGNOSIS — R112 Nausea with vomiting, unspecified: Secondary | ICD-10-CM | POA: Diagnosis not present

## 2018-08-28 DIAGNOSIS — G43709 Chronic migraine without aura, not intractable, without status migrainosus: Secondary | ICD-10-CM | POA: Diagnosis not present

## 2018-08-28 DIAGNOSIS — N898 Other specified noninflammatory disorders of vagina: Secondary | ICD-10-CM | POA: Diagnosis not present

## 2018-08-28 DIAGNOSIS — N760 Acute vaginitis: Secondary | ICD-10-CM | POA: Diagnosis not present

## 2018-08-28 DIAGNOSIS — B379 Candidiasis, unspecified: Secondary | ICD-10-CM | POA: Diagnosis not present

## 2018-09-15 DIAGNOSIS — L02415 Cutaneous abscess of right lower limb: Secondary | ICD-10-CM | POA: Diagnosis not present

## 2018-11-13 DIAGNOSIS — L732 Hidradenitis suppurativa: Secondary | ICD-10-CM | POA: Diagnosis not present

## 2018-11-13 DIAGNOSIS — Z113 Encounter for screening for infections with a predominantly sexual mode of transmission: Secondary | ICD-10-CM | POA: Diagnosis not present

## 2018-11-30 DIAGNOSIS — L732 Hidradenitis suppurativa: Secondary | ICD-10-CM | POA: Diagnosis not present

## 2018-11-30 DIAGNOSIS — L700 Acne vulgaris: Secondary | ICD-10-CM | POA: Diagnosis not present

## 2019-01-26 ENCOUNTER — Encounter: Payer: Self-pay | Admitting: Women's Health

## 2019-01-26 ENCOUNTER — Ambulatory Visit (INDEPENDENT_AMBULATORY_CARE_PROVIDER_SITE_OTHER): Payer: BLUE CROSS/BLUE SHIELD | Admitting: Women's Health

## 2019-01-26 VITALS — BP 124/82

## 2019-01-26 DIAGNOSIS — Z23 Encounter for immunization: Secondary | ICD-10-CM | POA: Diagnosis not present

## 2019-01-26 DIAGNOSIS — Z113 Encounter for screening for infections with a predominantly sexual mode of transmission: Secondary | ICD-10-CM

## 2019-01-26 LAB — WET PREP FOR TRICH, YEAST, CLUE

## 2019-01-26 NOTE — Patient Instructions (Signed)
Human Papillomavirus Quadrivalent Vaccine suspension for injection What is this medicine? HUMAN PAPILLOMAVIRUS VACCINE (HYOO muhn pap uh LOH muh vahy ruhs vak SEEN) is a vaccine. It is used to prevent infections of four types of the human papillomavirus. In women, the vaccine may lower your risk of getting cervical, vaginal, vulvar, or anal cancer and genital warts. In men, the vaccine may lower your risk of getting genital warts and anal cancer. You cannot get these diseases from the vaccine. This vaccine does not treat these diseases. This medicine may be used for other purposes; ask your health care provider or pharmacist if you have questions. COMMON BRAND NAME(S): Gardasil What should I tell my health care provider before I take this medicine? They need to know if you have any of these conditions: -fever or infection -hemophilia -HIV infection or AIDS -immune system problems -low platelet count -an unusual reaction to Human Papillomavirus Vaccine, yeast, other medicines, foods, dyes, or preservatives -pregnant or trying to get pregnant -breast-feeding How should I use this medicine? This vaccine is for injection in a muscle on your upper arm or thigh. It is given by a health care professional. Dennis Bast will be observed for 15 minutes after each dose. Sometimes, fainting happens after the vaccine is given. You may be asked to sit or lie down during the 15 minutes. Three doses are given. The second dose is given 2 months after the first dose. The last dose is given 4 months after the second dose. A copy of a Vaccine Information Statement will be given before each vaccination. Read this sheet carefully each time. The sheet may change frequently. Talk to your pediatrician regarding the use of this medicine in children. While this drug may be prescribed for children as  as 11 years of age for selected conditions, precautions do apply. Overdosage: If you think you have taken too much of this  medicine contact a poison control center or emergency room at once. NOTE: This medicine is only for you. Do not share this medicine with others. What if I miss a dose? All 3 doses of the vaccine should be given within 6 months. Remember to keep appointments for follow-up doses. Your health care provider will tell you when to return for the next vaccine. Ask your health care professional for advice if you are unable to keep an appointment or miss a scheduled dose. What may interact with this medicine? -other vaccines This list may not describe all possible interactions. Give your health care provider a list of all the medicines, herbs, non-prescription drugs, or dietary supplements you use. Also tell them if you smoke, drink alcohol, or use illegal drugs. Some items may interact with your medicine. What should I watch for while using this medicine? This vaccine may not fully protect everyone. Continue to have regular pelvic exams and cervical or anal cancer screenings as directed by your doctor. The Human Papillomavirus is a sexually transmitted disease. It can be passed by any kind of sexual activity that involves genital contact. The vaccine works best when given before you have any contact with the virus. Many people who have the virus do not have any signs or symptoms. Tell your doctor or health care professional if you have any reaction or unusual symptom after getting the vaccine. What side effects may I notice from receiving this medicine? Side effects that you should report to your doctor or health care professional as soon as possible: -allergic reactions like skin rash, itching or hives, swelling  from receiving this medicine?  Side effects that you should report to your doctor or health care professional as soon as possible:  -allergic reactions like skin rash, itching or hives, swelling of the face, lips, or tongue  -breathing problems  -feeling faint or lightheaded, falls  Side effects that usually do not require medical attention (report to your doctor or health care professional if they continue or are bothersome):  -cough  -dizziness  -fever  -headache  -nausea  -redness, warmth,  swelling, pain, or itching at site where injected  This list may not describe all possible side effects. Call your doctor for medical advice about side effects. You may report side effects to FDA at 1-800-FDA-1088.  Where should I keep my medicine?  This drug is given in a hospital or clinic and will not be stored at home.  NOTE: This sheet is a summary. It may not cover all possible information. If you have questions about this medicine, talk to your doctor, pharmacist, or health care provider.   2019 Elsevier/Gold Standard (2014-01-23 13:14:33)

## 2019-01-26 NOTE — Progress Notes (Signed)
Dawn Barnett January 16, 1991 [id number]    History:    Presents for new patient with problem.  Ex partner unfaithful, questionable vaginal discharge with odor. Requesting STD screen. Reports having recurrent BV in the past.  Denies urinary symptoms, abdominal/back pain, nausea or fever.    11/2016 Nexplanon rare bleeding.  States had annual exam August 2019 at [facility].  Has not had Gardasil.  Past medical history, past surgical history, family history and social history were all reviewed and documented in the EPIC chart.  Works at [employer]. Has 2 daughters ages 29 and 61 both doing well.  ROS:  A ROS was performed and pertinent positives and negatives are included.  Exam:  Vitals:   01/26/19 1023  BP: 124/82   There is no height or weight on file to calculate BMI.   General appearance:  Normal Thyroid:  Symmetrical, normal in size, without palpable masses or nodularity. Respiratory  Auscultation:  Clear without wheezing or rhonchi Cardiovascular  Auscultation:  Regular rate, without rubs, murmurs or gallops  Edema/varicosities:  Not grossly evident Abdominal  Soft,nontender, without masses, guarding or rebound.  Liver/spleen:  No organomegaly noted  Hernia:  None appreciated  Skin  Inspection:  Grossly normal   Breasts: Not done  Genitalia/mons:  Normal without inguinal adenopathy  External genitalia:  Normal  BUS/Urethra/Skene's glands:  Normal  Vagina:  Normal  Cervix:  Normal  Uterus:  normal in size, shape and contour.  Midline and mobile  Adnexa/parametria:     Rt: Without masses or tenderness.   Lt: Without masses or tenderness.  Anus and perineum: Normal   Assessment/Plan:  28 y.o. SBF G2 P2 requesting STD screen/unfaithful partner questionable vaginal discharge.  STD screen 11/2016 Nexplanon  Plan: GC/chlamydia, HIV, hep B, C, RPR.  Reviewed normality of wet prep and vaginal discharge.  Aware Nexplanon will need to be removed and replaced 11/2019.   Reviewed importance of condoms until permanent partner.  Gardasil reviewed, first given return to office in 2 and 6 months to complete series.   Harrington Chall[facility], 10:59 AM 01/26/2019

## 2019-01-27 LAB — C. TRACHOMATIS/N. GONORRHOEAE RNA
C. trachomatis RNA, TMA: NOT DETECTED
N. gonorrhoeae RNA, TMA: NOT DETECTED

## 2019-02-27 DIAGNOSIS — K047 Periapical abscess without sinus: Secondary | ICD-10-CM | POA: Diagnosis not present

## 2019-02-27 DIAGNOSIS — K0889 Other specified disorders of teeth and supporting structures: Secondary | ICD-10-CM | POA: Diagnosis not present

## 2019-03-18 ENCOUNTER — Other Ambulatory Visit: Payer: Self-pay

## 2019-03-21 ENCOUNTER — Ambulatory Visit (INDEPENDENT_AMBULATORY_CARE_PROVIDER_SITE_OTHER): Payer: BLUE CROSS/BLUE SHIELD | Admitting: Women's Health

## 2019-03-21 ENCOUNTER — Encounter: Payer: Self-pay | Admitting: Women's Health

## 2019-03-21 ENCOUNTER — Other Ambulatory Visit: Payer: Self-pay

## 2019-03-21 VITALS — BP 118/78

## 2019-03-21 DIAGNOSIS — N898 Other specified noninflammatory disorders of vagina: Secondary | ICD-10-CM

## 2019-03-21 DIAGNOSIS — Z113 Encounter for screening for infections with a predominantly sexual mode of transmission: Secondary | ICD-10-CM | POA: Diagnosis not present

## 2019-03-21 LAB — WET PREP FOR TRICH, YEAST, CLUE

## 2019-03-21 MED ORDER — FLUCONAZOLE 150 MG PO TABS: 150.0000 mg | ORAL_TABLET | Freq: Once | ORAL | 1 refills | Status: AC

## 2019-03-21 NOTE — Patient Instructions (Signed)
Vaginal Yeast infection, Adult    Vaginal yeast infection is a condition that causes vaginal discharge as well as soreness, swelling, and redness (inflammation) of the vagina. This is a common condition. Some women get this infection frequently.  What are the causes?  This condition is caused by a change in the normal balance of the yeast (candida) and bacteria that live in the vagina. This change causes an overgrowth of yeast, which causes the inflammation.  What increases the risk?  The condition is more likely to develop in women who:   Take antibiotic medicines.   Have diabetes.   Take birth control pills.   Are pregnant.   Douche often.   Have a weak body defense system (immune system).   Have been taking steroid medicines for a long time.   Frequently wear tight clothing.  What are the signs or symptoms?  Symptoms of this condition include:   White, thick, creamy vaginal discharge.   Swelling, itching, redness, and irritation of the vagina. The lips of the vagina (vulva) may be affected as well.   Pain or a burning feeling while urinating.   Pain during sex.  How is this diagnosed?  This condition is diagnosed based on:   Your medical history.   A physical exam.   A pelvic exam. Your health care provider will examine a sample of your vaginal discharge under a microscope. Your health care provider may send this sample for testing to confirm the diagnosis.  How is this treated?  This condition is treated with medicine. Medicines may be over-the-counter or prescription. You may be told to use one or more of the following:   Medicine that is taken by mouth (orally).   Medicine that is applied as a cream (topically).   Medicine that is inserted directly into the vagina (suppository).  Follow these instructions at home:    Lifestyle   Do not have sex until your health care provider approves. Tell your sex partner that you have a yeast infection. That person should go to his or her health care  provider and ask if they should also be treated.   Do not wear tight clothes, such as pantyhose or tight pants.   Wear breathable cotton underwear.  General instructions   Take or apply over-the-counter and prescription medicines only as told by your health care provider.   Eat more yogurt. This may help to keep your yeast infection from returning.   Do not use tampons until your health care provider approves.   Try taking a sitz bath to help with discomfort. This is a warm water bath that is taken while you are sitting down. The water should only come up to your hips and should cover your buttocks. Do this 3-4 times per day or as told by your health care provider.   Do not douche.   If you have diabetes, keep your blood sugar levels under control.   Keep all follow-up visits as told by your health care provider. This is important.  Contact a health care provider if:   You have a fever.   Your symptoms go away and then return.   Your symptoms do not get better with treatment.   Your symptoms get worse.   You have new symptoms.   You develop blisters in or around your vagina.   You have blood coming from your vagina and it is not your menstrual period.   You develop pain in your abdomen.  Summary     Vaginal yeast infection is a condition that causes discharge as well as soreness, swelling, and redness (inflammation) of the vagina.   This condition is treated with medicine. Medicines may be over-the-counter or prescription.   Take or apply over-the-counter and prescription medicines only as told by your health care provider.   Do not douche. Do not have sex or use tampons until your health care provider approves.   Contact a health care provider if your symptoms do not get better with treatment or your symptoms go away and then return.  This information is not intended to replace advice given to you by your health care provider. Make sure you discuss any questions you have with your health care  provider.  Document Released: 09/10/2005 Document Revised: 04/19/2018 Document Reviewed: 04/19/2018  Elsevier Interactive Patient Education  2019 Elsevier Inc.

## 2019-03-21 NOTE — Progress Notes (Signed)
28 year old SBF G2 P2 presents with complaint of vaginal discharge with itching and irritation.  Denies vaginal odor, urinary symptoms, abdominal pain, nausea or fever.  New partner has used condoms consistently.  01/2018 Nexplanon amenorrheic.  Exam: Appears well.  External genitalia erythemic at introitus, speculum exam vaginal walls erythemic, white discharge without odor noted, wet prep positive for yeast.  GC/Chlamydia culture pending.  Bimanual no CMT or adnexal tenderness.  Yeast vaginitis STD screen  Plan: Diflucan 150 p.o. x1 dose.  Yeast prevention discussed.  Instructed to call if no relief.  GC/Chlamydia culture pending, will check HIV, RPR and annual exam.  Continue condoms until permanent partner.

## 2019-03-21 NOTE — Addendum Note (Signed)
Addended by: Rushie Goltz on: 03/21/2019 03:14 PM   Modules accepted: Orders

## 2019-03-22 LAB — C. TRACHOMATIS/N. GONORRHOEAE RNA
C. trachomatis RNA, TMA: NOT DETECTED
N. gonorrhoeae RNA, TMA: NOT DETECTED

## 2019-06-21 DIAGNOSIS — Z113 Encounter for screening for infections with a predominantly sexual mode of transmission: Secondary | ICD-10-CM | POA: Diagnosis not present

## 2019-07-25 ENCOUNTER — Telehealth (INDEPENDENT_AMBULATORY_CARE_PROVIDER_SITE_OTHER): Payer: BC Managed Care – PPO | Admitting: Family Medicine

## 2019-07-25 ENCOUNTER — Other Ambulatory Visit: Payer: Self-pay

## 2019-07-25 VITALS — Ht 65.0 in

## 2019-07-25 DIAGNOSIS — G4700 Insomnia, unspecified: Secondary | ICD-10-CM | POA: Diagnosis not present

## 2019-07-25 DIAGNOSIS — F331 Major depressive disorder, recurrent, moderate: Secondary | ICD-10-CM

## 2019-07-25 DIAGNOSIS — F419 Anxiety disorder, unspecified: Secondary | ICD-10-CM | POA: Insufficient documentation

## 2019-07-25 DIAGNOSIS — E669 Obesity, unspecified: Secondary | ICD-10-CM

## 2019-07-25 MED ORDER — FLUOXETINE HCL 20 MG PO TABS: 20.0000 mg | ORAL_TABLET | Freq: Every day | ORAL | 1 refills | Status: DC

## 2019-07-25 NOTE — Progress Notes (Signed)
Virtual Visit via Video Note   I connected with Dawn Barnett on 07/25/19 by a video enabled telemedicine application and verified that I am speaking with the correct person using two identifiers.  Location patient: home Location provider:work office Persons participating in the virtual visit: patient, provider  I discussed the limitations of evaluation and management by telemedicine and the availability of in person appointments. The patient expressed understanding and agreed to proceed.   HPI: Dawn Barnett is a 28 yo female with Hx of anxiety and derepression who is c/o worsening anxiety. She was last seen on 07/01/2017, when she was complaining about worsening depression.  For the past week she has had 3 episodes of anxiety. She feels like she can not breath,crying,feeling nervous.  She has had a couple episodes when she "passed out" for a few seconds. She has had under some stress at work. She has not had episodes at home.  She denies fever,chills,night sweats,CP,exertional dyspnea,palpitations,abdominal pain,N/V,chnages in bowel habits,or urinary symptoms.  According to patient EMS has been called and reported vital signs in normal range as well as fingerstick.  Hx of insomnia,states she is sleeping "a little" better, about 4 hours. Increased appetite.  She is requesting FMLA to be filled out,so she can leave work is needed due to anxiety.She is trying to establish with psychiatrist.  2 years ago she was on Fluoxetine 20 mg and Mirtazapine 15 mg at bedtime, not compliant but she would like to resume meds because they were helping. Denies suicidal thoughts.  She has decreased calorie intake and walking daily, she has lost about 24 pounds since April 2020 to date.  ROS: See pertinent positives and negatives per HPI.  Past Medical History:  Diagnosis Date  . Abnormal Pap smear 01/2009  . BV (bacterial vaginosis) 2010  . Dysplasia 2010  . H/O cervicitis 2010  . H/O gonorrhea 2010  .  H/O varicella   . History of chlamydia 06/05/09  . HPV in female   . Irregular periods/menstrual cycles   . Itching 09/04/2009  . Pelvic pain in female 2010  . Syphilis 2006  . Weight loss     No past surgical history on file.  Family History  Problem Relation Age of Onset  . Drug abuse Father   . Alcohol abuse Father   . COPD Maternal Grandmother        Emphysema    Social History   Socioeconomic History  . Marital status: Single    Spouse name: Not on file  . Number of children: Not on file  . Years of education: Not on file  . Highest education level: Not on file  Occupational History  . Not on file  Social Needs  . Financial resource strain: Not on file  . Food insecurity    Worry: Not on file    Inability: Not on file  . Transportation needs    Medical: Not on file    Non-medical: Not on file  Tobacco Use  . Smoking status: Former Smoker    Packs/day: 0.25    Quit date: 03/21/2012    Years since quitting: 7.3  . Smokeless tobacco: Never Used  Substance and Sexual Activity  . Alcohol use: No  . Drug use: No    Types: Marijuana    Comment: 4 weeks ago  . Sexual activity: Not Currently    Birth control/protection: None  Lifestyle  . Physical activity    Days per week: Not on file  Minutes per session: Not on file  . Stress: Not on file  Relationships  . Social Herbalist on phone: Not on file    Gets together: Not on file    Attends religious service: Not on file    Active member of club or organization: Not on file    Attends meetings of clubs or organizations: Not on file    Relationship status: Not on file  . Intimate partner violence    Fear of current or ex partner: Not on file    Emotionally abused: Not on file    Physically abused: Not on file    Forced sexual activity: Not on file  Other Topics Concern  . Not on file  Social History Narrative  . Not on file     Current Outpatient Medications:  .  etonogestrel (NEXPLANON)  68 MG IMPL implant, 1 each by Subdermal route once., Disp: , Rfl:  .  FLUoxetine (PROZAC) 20 MG tablet, Take 1 tablet (20 mg total) by mouth daily., Disp: 30 tablet, Rfl: 1 .  valACYclovir (VALTREX) 500 MG tablet, Take 500 mg by mouth 2 (two) times daily., Disp: , Rfl:   EXAM:  VITALS per patient if applicable:Ht 5\' 5"  (1.651 m)   BMI 35.82 kg/m   LMP: Implant in 01/2018.  Wt Readings from Last 3 Encounters:  07/01/17 215 lb 4 oz (97.6 kg)  06/12/17 202 lb (91.6 kg)  03/26/17 208 lb 2 oz (94.4 kg)    GENERAL: alert, oriented, appears well and in no acute distress  HEENT: atraumatic, conjunctiva clear, no obvious facial abnormalities.  NECK: normal movements of the head and neck  LUNGS: on inspection no signs of respiratory distress, breathing rate appears normal, no obvious gross SOB, gasping or wheezing  CV: no obvious cyanosis  Dawn: moves all visible extremities without noticeable abnormality  PSYCH/NEURO: pleasant and cooperative, no obvious depression or anxiety, speech and thought processing grossly intact  ASSESSMENT AND PLAN:  Discussed the following assessment and plan:  Insomnia, unspecified type - Plan: Ambulatory referral to Psychiatry Good sleep hygiene. Mirtazapine 15 mg at bedtime. She can also try OTC Melatonin. Continue following with psychiatrist.  Moderate episode of recurrent major depressive disorder ([place]) - Plan: FLUoxetine (PROZAC) 20 MG tablet, Ambulatory referral to Psychiatry Fluoxetine 20 mg daily. Some side effects discussed. Instructed about warning signs. I gave her some phone numbers of providers in this area. She would like a referral place in case it is needed but she will arrange appt.  Anxiety disorder, unspecified type - Plan: FLUoxetine (PROZAC) 20 MG tablet, Ambulatory referral to Psychiatry Fluoxetine 20 mg daily. FMLA will be completed, 16 hours per month x 3 months,if extension is needed she was instructed to discuss it with  psychiatrist.   Obesity, Class I, BMI 30-34.9 - Plan: Reporting 24 Lb lost. We discussed benefits of wt loss as well as adverse effects of obesity. Encouraged to be consistent with healthy diet and physical activity recommended.   I discussed the assessment and treatment plan with the patient. She was provided an opportunity to ask questions and all were answered. She agreed with the plan and demonstrated an understanding of the instructions.   The patient was advised to call back or seek an in-person evaluation if the symptoms worsen or if the condition fails to improve as anticipated.  She needs f/u in 4-6 weeks,if she has not established with psychiatrist ,I will see her back.    Inez Catalina  [place], MD

## 2019-07-28 ENCOUNTER — Encounter: Payer: Self-pay | Admitting: Family Medicine

## 2019-08-05 ENCOUNTER — Encounter: Payer: Self-pay | Admitting: Family Medicine

## 2019-08-10 ENCOUNTER — Encounter: Payer: BLUE CROSS/BLUE SHIELD | Admitting: Women's Health

## 2019-08-16 ENCOUNTER — Encounter: Payer: BC Managed Care – PPO | Admitting: Family Medicine

## 2019-08-31 ENCOUNTER — Encounter: Payer: Self-pay | Admitting: Family Medicine

## 2019-08-31 ENCOUNTER — Other Ambulatory Visit: Payer: Self-pay

## 2019-08-31 ENCOUNTER — Ambulatory Visit (INDEPENDENT_AMBULATORY_CARE_PROVIDER_SITE_OTHER): Payer: BC Managed Care – PPO | Admitting: Family Medicine

## 2019-08-31 VITALS — BP 122/84 | HR 72 | Temp 98.4°F | Resp 12 | Ht 65.0 in | Wt 206.1 lb

## 2019-08-31 DIAGNOSIS — R51 Headache: Secondary | ICD-10-CM | POA: Diagnosis not present

## 2019-08-31 DIAGNOSIS — F331 Major depressive disorder, recurrent, moderate: Secondary | ICD-10-CM

## 2019-08-31 DIAGNOSIS — Z0000 Encounter for general adult medical examination without abnormal findings: Secondary | ICD-10-CM | POA: Diagnosis not present

## 2019-08-31 DIAGNOSIS — Z1329 Encounter for screening for other suspected endocrine disorder: Secondary | ICD-10-CM

## 2019-08-31 DIAGNOSIS — Z13228 Encounter for screening for other metabolic disorders: Secondary | ICD-10-CM

## 2019-08-31 DIAGNOSIS — Z130 Encounter for screening for diseases of the blood and blood-forming organs and certain disorders involving the immune mechanism: Secondary | ICD-10-CM | POA: Diagnosis not present

## 2019-08-31 DIAGNOSIS — G4700 Insomnia, unspecified: Secondary | ICD-10-CM

## 2019-08-31 DIAGNOSIS — E669 Obesity, unspecified: Secondary | ICD-10-CM

## 2019-08-31 DIAGNOSIS — R519 Headache, unspecified: Secondary | ICD-10-CM

## 2019-08-31 LAB — CBC
HCT: 38.6 % (ref 36.0–46.0)
Hemoglobin: 12.6 g/dL (ref 12.0–15.0)
MCHC: 32.5 g/dL (ref 30.0–36.0)
MCV: 94.5 fl (ref 78.0–100.0)
Platelets: 224 10*3/uL (ref 150.0–400.0)
RBC: 4.09 Mil/uL (ref 3.87–5.11)
RDW: 13.1 % (ref 11.5–15.5)
WBC: 5.5 10*3/uL (ref 4.0–10.5)

## 2019-08-31 LAB — BASIC METABOLIC PANEL
BUN: 18 mg/dL (ref 6–23)
CO2: 25 mEq/L (ref 19–32)
Calcium: 9.3 mg/dL (ref 8.4–10.5)
Chloride: 107 mEq/L (ref 96–112)
Creatinine, Ser: 0.94 mg/dL (ref 0.40–1.20)
GFR: 85.1 mL/min (ref >60.00)
Glucose, Bld: 85 mg/dL (ref 70–99)
Potassium: 4.6 mEq/L (ref 3.5–5.1)
Sodium: 140 mEq/L (ref 135–145)

## 2019-08-31 LAB — TSH: TSH: 0.41 u[IU]/mL (ref 0.35–4.50)

## 2019-08-31 LAB — HEMOGLOBIN A1C: Hgb A1c MFr Bld: 6.2 % (ref 4.6–6.5)

## 2019-08-31 MED ORDER — AMITRIPTYLINE HCL 25 MG PO TABS: 12.5000 mg | ORAL_TABLET | Freq: Every day | ORAL | 0 refills | Status: DC

## 2019-08-31 NOTE — Progress Notes (Addendum)
HPI:   Dawn Barnett is a 28 y.o. female, who is here today for her routine physical.  Last CPE: 2017, gyn preventive visit.  Regular exercise 3 or more time per week: Walking and running 3 times per week. Following a healthful diet: Not consistently. She lives with her daughters ,79 and 59 yo.  Chronic medical problems: Anxiety,depression,obesity,hx of STD's.  Pap smear: 05/2016. She is established with gyn, Dr Pennie Rushing Hx of abnormal pap smears: Denies. Hx of STD's:Chlamydia,siphillis,and gonorrhea. Nexplanon for birth control.   Immunization History  Administered Date(s) Administered  . HPV 9-valent 01/26/2019   Concerns today:  Today she is c/o "migraine" headache that started 6 months ago. Reporting problem as new. She does not have Hx of migraines and has not been formally diagnosed.  She has had right frontal dull headache,daily,intermittent. "Terrible" headache,7/10. No associated N/V,photophobia,visual changes,or focal deficit.  She has not tried OTC meds.  Mother has Hx of migraines.  She has not identified exacerbating or alleviating factors.  Insomnia, sleeps about 4 hours,wakes up frequently through the night.  Hx of anxiety and depression,she is on Prozac 20 mg daily. She has not established with psychiatrist as strongly recommended last OV.   Lab Results  Component Value Date   WBC 5.2 03/11/2017   HGB 12.5 03/11/2017   HCT 37.3 03/11/2017   MCV 89.7 03/11/2017   PLT 239 03/11/2017    Depression screen PHQ 2/9 08/31/2019 07/01/2017 07/01/2017 02/12/2017  Decreased Interest 2 3 3 2   Down, Depressed, Hopeless 2 3 3 3   PHQ - 2 Score 4 6 6 5   Altered sleeping 3 - 3 3  Tired, decreased energy 3 - 2 3  Change in appetite 1 - 2 0  Feeling bad or failure about yourself  2 - 2 2  Trouble concentrating 2 - 2 2  Moving slowly or fidgety/restless 0 - 2 0  Suicidal thoughts 0 - 0 0  PHQ-9 Score 15 - 19 15  Difficult doing work/chores  Somewhat difficult Very difficult - -   GAD 7 : Generalized Anxiety Score 08/31/2019  Nervous, Anxious, on Edge 2  Control/stop worrying 2  Worry too much - different things 2  Trouble relaxing 2  Restless 0  Easily annoyed or irritable 2  Afraid - awful might happen 0  Total GAD 7 Score 10  Anxiety Difficulty Somewhat difficult      Review of Systems  Constitutional: Negative for appetite change, fatigue and fever.  HENT: Negative for dental problem, hearing loss, mouth sores, sore throat, trouble swallowing and voice change.   Eyes: Negative for redness and visual disturbance.  Respiratory: Negative for cough, shortness of breath and wheezing.   Cardiovascular: Negative for chest pain and leg swelling.  Gastrointestinal: Negative for abdominal pain, nausea and vomiting.       No changes in bowel habits.  Endocrine: Negative for cold intolerance, heat intolerance, polydipsia, polyphagia and polyuria.  Genitourinary: Negative for decreased urine volume, dysuria, hematuria, vaginal bleeding and vaginal discharge.  Musculoskeletal: Negative for arthralgias, gait problem and myalgias.  Skin: Negative for color change and rash.  Allergic/Immunologic: Negative for environmental allergies.  Neurological: Negative for syncope, weakness.Marland Kitchen Headache. Hematological: Negative for adenopathy. Does not bruise/bleed easily.  Psychiatric/Behavioral: Negative for confusion, + sleep disturbance. The patient is nervous/anxious. All other systems reviewed and are negative.   Current Outpatient Medications on File Prior to Visit  Medication Sig Dispense Refill  . DOXYCYCLINE HYCLATE PO  Take 1 tablet by mouth daily. 1 tablet daily for acne    . etonogestrel (NEXPLANON) 68 MG IMPL implant 1 each by Subdermal route once.    Marland Kitchen. FLUoxetine (PROZAC) 20 MG tablet Take 1 tablet (20 mg total) by mouth daily. 30 tablet 1  . valACYclovir (VALTREX) 500 MG tablet Take 500 mg by mouth 2 (two) times daily.      No current facility-administered medications on file prior to visit.      Past Medical History:  Diagnosis Date  . Abnormal Pap smear 01/2009  . BV (bacterial vaginosis) 2010  . Dysplasia 2010  . H/O cervicitis 2010  . H/O gonorrhea 2010  . H/O varicella   . History of chlamydia 06/05/09  . HPV in female   . Irregular periods/menstrual cycles   . Itching 09/04/2009  . Pelvic pain in female 2010  . Syphilis 2006  . Weight loss     History reviewed. No pertinent surgical history.  Allergies  Allergen Reactions  . Other     Family History  Problem Relation Age of Onset  . Drug abuse Father   . Alcohol abuse Father   . COPD Maternal Grandmother        Emphysema    Social History   Socioeconomic History  . Marital status: Single    Spouse name: Not on file  . Number of children: Not on file  . Years of education: Not on file  . Highest education level: Not on file  Occupational History  . Not on file  Social Needs  . Financial resource strain: Not on file  . Food insecurity    Worry: Not on file    Inability: Not on file  . Transportation needs    Medical: Not on file    Non-medical: Not on file  Tobacco Use  . Smoking status: Former Smoker    Packs/day: 0.25    Quit date: 03/21/2012    Years since quitting: 7.4  . Smokeless tobacco: Never Used  Substance and Sexual Activity  . Alcohol use: No  . Drug use: No    Types: Marijuana    Comment: 4 weeks ago  . Sexual activity: Not Currently    Birth control/protection: None  Lifestyle  . Physical activity    Days per week: Not on file    Minutes per session: Not on file  . Stress: Not on file  Relationships  . Social Musicianconnections    Talks on phone: Not on file    Gets together: Not on file    Attends religious service: Not on file    Active member of club or organization: Not on file    Attends meetings of clubs or organizations: Not on file    Relationship status: Not on file  Other Topics Concern  .  Not on file  Social History Narrative  . Not on file    Vitals:   08/31/19 1022  BP: 122/84  Pulse: 72  Resp: 12  Temp: 98.4 F (36.9 C)  SpO2: 100%   Body mass index is 34.3 kg/m.   Wt Readings from Last 3 Encounters:  08/31/19 206 lb 2 oz (93.5 kg)  07/01/17 215 lb 4 oz (97.6 kg)  06/12/17 202 lb (91.6 kg)     Physical Exam  Nursing note and vitals reviewed. Constitutional: She is oriented to person, place, and time. She appears well-developed. No distress.  HENT:  Head: Normocephalic and atraumatic.  Right Ear: Hearing, tympanic  membrane, external ear and ear canal normal.  Left Ear: Hearing, tympanic membrane, external ear and ear canal normal.  Mouth/Throat: Uvula is midline, oropharynx is clear and moist and mucous membranes are normal.  Eyes: Pupils are equal, round, and reactive to light. Conjunctivae and EOM are normal.  Neck: No tracheal deviation present. No thyromegaly present.  Cardiovascular: Normal rate and regular rhythm.  No murmur heard. Pulses:      Dorsalis pedis pulses are 2+ on the right side, and 2+ on the left side.  Respiratory: Effort normal and breath sounds normal. No respiratory distress.  GI: Soft. She exhibits no mass. There is no hepatomegaly. There is no tenderness.  Genitourinary:Comments: Deferred to gyn.  Musculoskeletal: She exhibits no edema.  No major deformity or signs of synovitis appreciated.  Lymphadenopathy:    She has no cervical adenopathy.       Right: No supraclavicular adenopathy present.       Left: No supraclavicular adenopathy present.  Neurological: She is alert and oriented to person, place, and time. She has normal strength. No cranial nerve deficit. Coordination and gait normal.  Reflex Scores:      Bicep reflexes are 2+ on the right side and 2+ on the left side.      Patellar reflexes are 2+ on the right side and 2+ on the left side. Skin: Skin is warm. No rash noted. No erythema.  Psychiatric: She has a  normal mood and affect. Cognitive function grossly intact. Well groomed, good eye contact.     ASSESSMENT AND PLAN:  Ms. Tiffancy Moger was here today annual physical examination.   Orders Placed This Encounter  Procedures  . CBC  . Basic metabolic panel  . Hemoglobin A1c  . TSH   Lab Results  Component Value Date   HGBA1C 6.2 08/31/2019   Lab Results  Component Value Date   CREATININE 0.94 08/31/2019   BUN 18 08/31/2019   NA 140 08/31/2019   K 4.6 08/31/2019   CL 107 08/31/2019   CO2 25 08/31/2019   Lab Results  Component Value Date   TSH 0.41 08/31/2019   Lab Results  Component Value Date   WBC 5.5 08/31/2019   HGB 12.6 08/31/2019   HCT 38.6 08/31/2019   MCV 94.5 08/31/2019   PLT 224.0 08/31/2019   Routine general medical examination at a health care facility We discussed the importance of regular physical activity and healthy diet for prevention of chronic illness and/or complications. Preventive guidelines reviewed. Vaccination up to date. She will continue following with gynecologist for her female preventive care.  Next CPE in a year.  Headache, unspecified headache type ? Tension headache. She c/o frontal HA during OV 07/01/2017. Treatment options discussed, she agrees with trying Amitriptyline 12.5 mg daily at bedtime. Headache diary. F/U in 5 weeks,before if needed. Instructed about warning signs.  -     CBC -     TSH -     amitriptyline (ELAVIL) 25 MG tablet; Take 0.5 tablets (12.5 mg total) by mouth at bedtime.  Screening for endocrine, metabolic and immunity disorder -     Basic metabolic panel -     Hemoglobin A1c  Obesity, Class I, BMI 30-34.9 We discussed benefits of wt loss as well as adverse effects of obesity. Consistency with healthy diet and physical activity recommended.  Insomnia, unspecified type This is a chronic problem. Good sleep hygiene recommended. Amitriptyline 25 mg 1/2 tab at bedtime may help. F/U in 4-5  weeks.  Moderate episode of recurrent major depressive disorder ([facility]) No changes in Prozac 20 mg. Amitriptyline added today. Strongly recommend establishing with psychiatrist.  Return in 5 weeks (on 10/05/2019) for headache.   Rahma Meller G. [place], MD  [facility]. [place] office.

## 2019-08-31 NOTE — Patient Instructions (Addendum)
A few things to remember from today's visit:   Routine general medical examination at a health care facility  Headache, unspecified headache type - Plan: CBC, TSH, amitriptyline (ELAVIL) 25 MG tablet  Screening for endocrine, metabolic and immunity disorder - Plan: Basic metabolic panel, Hemoglobin A1c  Amitriptyline 25 mg 1/2 tablet every night. Keep a headache diary. I would like to see you back in 4 to 6 weeks.   Today you have you routine preventive visit.  At least 150 minutes of moderate exercise per week, daily brisk walking for 15-30 min is a good exercise option. Healthy diet low in saturated (animal) fats and sweets and consisting of fresh fruits and vegetables, lean meats such as fish and white chicken and whole grains.  These are some of recommendations for screening depending of age and risk factors:   - Vaccines:  Tdap vaccine every 10 years.  Shingles vaccine recommended at age 43, could be given after 29 years of age but not sure about insurance coverage.   Pneumonia vaccines:  Pneumovax at 43. Sometimes Pneumovax is giving earlier if history of smoking, lung disease,diabetes,kidney disease among some.    Screening for diabetes at age 36 and every 3 years.  Cervical cancer prevention:  Pap smear starts at 29 years of age and continues periodically until 29 years old in low risk women. Pap smear every 3 years between 67 and 79 years old. Pap smear every 3-5 years between women 4 and older if pap smear negative and HPV screening negative.   -Breast cancer: Mammogram: There is disagreement between experts about when to start screening in low risk asymptomatic female but recent recommendations are to start screening at 44 and not later than 29 years old , every 1-2 years and after 29 yo q 2 years. Screening is recommended until 29 years old but some women can continue screening depending of healthy issues.   Colon cancer screening: starts at 29 years old until  29 years old.  Cholesterol disorder screening at age 78 and every 3 years.  Also recommended:  1. Dental visit- Brush and floss your teeth twice daily; visit your dentist twice a year. 2. Eye doctor- Get an eye exam at least every 2 years. 3. Helmet use- Always wear a helmet when riding a bicycle, motorcycle, rollerblading or skateboarding. 4. Safe sex- If you may be exposed to sexually transmitted infections, use a condom. 5. Seat belts- Seat belts can save your live; always wear one. 6. Smoke/Carbon Monoxide detectors- These detectors need to be installed on the appropriate level of your home. Replace batteries at least once a year. 7. Skin cancer- When out in the sun please cover up and use sunscreen 15 SPF or higher. 8. Violence- If anyone is threatening or hurting you, please tell your healthcare provider.  9. Drink alcohol in moderation- Limit alcohol intake to one drink or less per day. Never drink and drive.

## 2019-09-04 ENCOUNTER — Encounter: Payer: Self-pay | Admitting: Family Medicine

## 2019-10-11 ENCOUNTER — Other Ambulatory Visit: Payer: Self-pay

## 2019-10-12 ENCOUNTER — Encounter: Payer: Self-pay | Admitting: Family Medicine

## 2019-10-12 ENCOUNTER — Ambulatory Visit (INDEPENDENT_AMBULATORY_CARE_PROVIDER_SITE_OTHER): Payer: BC Managed Care – PPO | Admitting: Women's Health

## 2019-10-12 ENCOUNTER — Encounter: Payer: Self-pay | Admitting: Women's Health

## 2019-10-12 ENCOUNTER — Ambulatory Visit (INDEPENDENT_AMBULATORY_CARE_PROVIDER_SITE_OTHER): Payer: BC Managed Care – PPO | Admitting: Family Medicine

## 2019-10-12 VITALS — BP 110/60 | Temp 97.0°F | Resp 12 | Ht 65.0 in | Wt 214.2 lb

## 2019-10-12 VITALS — BP 120/82

## 2019-10-12 DIAGNOSIS — G4700 Insomnia, unspecified: Secondary | ICD-10-CM | POA: Diagnosis not present

## 2019-10-12 DIAGNOSIS — Z113 Encounter for screening for infections with a predominantly sexual mode of transmission: Secondary | ICD-10-CM | POA: Diagnosis not present

## 2019-10-12 DIAGNOSIS — F331 Major depressive disorder, recurrent, moderate: Secondary | ICD-10-CM | POA: Diagnosis not present

## 2019-10-12 DIAGNOSIS — F419 Anxiety disorder, unspecified: Secondary | ICD-10-CM

## 2019-10-12 DIAGNOSIS — E669 Obesity, unspecified: Secondary | ICD-10-CM | POA: Diagnosis not present

## 2019-10-12 DIAGNOSIS — R519 Headache, unspecified: Secondary | ICD-10-CM | POA: Diagnosis not present

## 2019-10-12 LAB — WET PREP FOR TRICH, YEAST, CLUE

## 2019-10-12 MED ORDER — FLUOXETINE HCL 20 MG PO TABS: 20.0000 mg | ORAL_TABLET | Freq: Every day | ORAL | 1 refills | Status: DC

## 2019-10-12 MED ORDER — TOPIRAMATE 50 MG PO TABS: 50.0000 mg | ORAL_TABLET | Freq: Every day | ORAL | 1 refills | Status: DC

## 2019-10-12 MED ORDER — CLONAZEPAM 0.5 MG PO TABS: 0.2500 mg | ORAL_TABLET | Freq: Every day | ORAL | 1 refills | Status: AC | PRN

## 2019-10-12 MED ORDER — FLUCONAZOLE 150 MG PO TABS: 150.0000 mg | ORAL_TABLET | Freq: Once | ORAL | 0 refills | Status: AC

## 2019-10-12 MED ORDER — PHENTERMINE HCL 37.5 MG PO TABS: 37.5000 mg | ORAL_TABLET | Freq: Every day | ORAL | 1 refills | Status: AC

## 2019-10-12 MED ORDER — METRONIDAZOLE 0.75 % VA GEL: VAGINAL | 0 refills | Status: AC

## 2019-10-12 NOTE — Progress Notes (Signed)
Patient is scheduled for 11:30

## 2019-10-12 NOTE — Patient Instructions (Signed)
Bacterial Vaginosis  Bacterial vaginosis is a vaginal infection that occurs when the normal balance of bacteria in the vagina is disrupted. It results from an overgrowth of certain bacteria. This is the most common vaginal infection among women ages 15-44. Because bacterial vaginosis increases your risk for STIs (sexually transmitted infections), getting treated can help reduce your risk for chlamydia, gonorrhea, herpes, and HIV (human immunodeficiency virus). Treatment is also important for preventing complications in pregnant women, because this condition can cause an early (premature) delivery. What are the causes? This condition is caused by an increase in harmful bacteria that are normally present in small amounts in the vagina. However, the reason that the condition develops is not fully understood. What increases the risk? The following factors may make you more likely to develop this condition:  Having a new sexual partner or multiple sexual partners.  Having unprotected sex.  Douching.  Having an intrauterine device (IUD).  Smoking.  Drug and alcohol abuse.  Taking certain antibiotic medicines.  Being pregnant. You cannot get bacterial vaginosis from toilet seats, bedding, swimming pools, or contact with objects around you. What are the signs or symptoms? Symptoms of this condition include:  Grey or white vaginal discharge. The discharge can also be watery or foamy.  A fish-like odor with discharge, especially after sexual intercourse or during menstruation.  Itching in and around the vagina.  Burning or pain with urination. Some women with bacterial vaginosis have no signs or symptoms. How is this diagnosed? This condition is diagnosed based on:  Your medical history.  A physical exam of the vagina.  Testing a sample of vaginal fluid under a microscope to look for a large amount of bad bacteria or abnormal cells. Your health care provider may use a cotton swab or  a small wooden spatula to collect the sample. How is this treated? This condition is treated with antibiotics. These may be given as a pill, a vaginal cream, or a medicine that is put into the vagina (suppository). If the condition comes back after treatment, a second round of antibiotics may be needed. Follow these instructions at home: Medicines  Take over-the-counter and prescription medicines only as told by your health care provider.  Take or use your antibiotic as told by your health care provider. Do not stop taking or using the antibiotic even if you start to feel better. General instructions  If you have a female sexual partner, tell her that you have a vaginal infection. She should see her health care provider and be treated if she has symptoms. If you have a female sexual partner, he does not need treatment.  During treatment: ? Avoid sexual activity until you finish treatment. ? Do not douche. ? Avoid alcohol as directed by your health care provider. ? Avoid breastfeeding as directed by your health care provider.  Drink enough water and fluids to keep your urine clear or pale yellow.  Keep the area around your vagina and rectum clean. ? Wash the area daily with warm water. ? Wipe yourself from front to back after using the toilet.  Keep all follow-up visits as told by your health care provider. This is important. How is this prevented?  Do not douche.  Wash the outside of your vagina with warm water only.  Use protection when having sex. This includes latex condoms and dental dams.  Limit how many sexual partners you have. To help prevent bacterial vaginosis, it is best to have sex with just one partner (  monogamous).  Make sure you and your sexual partner are tested for STIs.  Wear cotton or cotton-lined underwear.  Avoid wearing tight pants and pantyhose, especially during summer.  Limit the amount of alcohol that you drink.  Do not use any products that contain  nicotine or tobacco, such as cigarettes and e-cigarettes. If you need help quitting, ask your health care provider.  Do not use illegal drugs. Where to find more information  Centers for Disease Control and Prevention: www.cdc.gov/std  American Sexual Health Association (ASHA): www.ashastd.org  [place] Department of Health and Human Services, Office on Women's Health: www.womenshealth.gov/ or https://www.womenshealth.gov/a-z-topics/bacterial-vaginosis Contact a health care provider if:  Your symptoms do not improve, even after treatment.  You have more discharge or pain when urinating.  You have a fever.  You have pain in your abdomen.  You have pain during sex.  You have vaginal bleeding between periods. Summary  Bacterial vaginosis is a vaginal infection that occurs when the normal balance of bacteria in the vagina is disrupted.  Because bacterial vaginosis increases your risk for STIs (sexually transmitted infections), getting treated can help reduce your risk for chlamydia, gonorrhea, herpes, and HIV (human immunodeficiency virus). Treatment is also important for preventing complications in pregnant women, because the condition can cause an early (premature) delivery.  This condition is treated with antibiotic medicines. These may be given as a pill, a vaginal cream, or a medicine that is put into the vagina (suppository). This information is not intended to replace advice given to you by your health care provider. Make sure you discuss any questions you have with your health care provider. Document Released: 12/01/2005 Document Revised: 11/13/2017 Document Reviewed: 08/16/2016 Elsevier Patient Education  2020 Elsevier Inc.  

## 2019-10-12 NOTE — Progress Notes (Signed)
HPI:   Dawn Barnett is a 28 y.o. female, who is here today for chronic disease management.   She was last seen on 08/31/19, when she was c/o 6 months of daily headaches. Right frontal dull headaches, 7/10. Also difficulty with falling and staying asleep. She was started on Amitriptyline 25 mg at bedtime. She has tolerated medication well. Headache has improved, having about 2 per week and they are "not bad."  Sleeping better, about 6 hours,still she does not feel rested.  She has gained wt since her last OV, increased appetite. She thinks she follows a healthful diet, snacking more frequent at the end of the day: cookies,cakes,and bread.  She is walking 30 min a few times per week.  Depression and anxiety: She is having panic attacks at work. Requesting intermittent FMLA,so she can leave work when she is anxious. She states that she has tried to establish with psychiatrist but she has been asked for $150-200 in front before she ca be seen.  She is on Prozac 20 mg , which has helped with depression.  Denies suicidal thoughts.     Review of Systems  Constitutional: Positive for appetite change and fatigue. Negative for activity change and fever.  HENT: Negative for mouth sores and nosebleeds.   Eyes: Negative for redness and visual disturbance.  Respiratory: Negative for cough, shortness of breath and wheezing.   Cardiovascular: Negative for chest pain, palpitations and leg swelling.  Gastrointestinal: Negative for abdominal pain, nausea and vomiting.       Negative for changes in bowel habits.  Endocrine: Negative for polydipsia, polyphagia and polyuria.  Neurological: Negative for syncope, facial asymmetry and speech difficulty.  Psychiatric/Behavioral: Negative for confusion and hallucinations.  Rest see pertinent positives and negatives per HPI.   Current Outpatient Medications on File Prior to Visit  Medication Sig Dispense Refill  . DOXYCYCLINE  HYCLATE PO Take 1 tablet by mouth daily. 1 tablet daily for acne    . etonogestrel (NEXPLANON) 68 MG IMPL implant 1 each by Subdermal route once.    . valACYclovir (VALTREX) 500 MG tablet Take 500 mg by mouth 2 (two) times daily.     No current facility-administered medications on file prior to visit.    Past Medical History:  Diagnosis Date  . Abnormal Pap smear 01/2009  . BV (bacterial vaginosis) 2010  . Dysplasia 2010  . H/O cervicitis 2010  . H/O gonorrhea 2010  . H/O varicella   . History of chlamydia 06/05/09  . HPV in female   . Irregular periods/menstrual cycles   . Itching 09/04/2009  . Pelvic pain in female 2010  . Syphilis 2006  . Weight loss    Allergies  Allergen Reactions  . Other     Social History   Socioeconomic History  . Marital status: Single    Spouse name: Not on file  . Number of children: Not on file  . Years of education: Not on file  . Highest education level: Not on file  Occupational History  . Not on file  Social Needs  . Financial resource strain: Not on file  . Food insecurity    Worry: Not on file    Inability: Not on file  . Transportation needs    Medical: Not on file    Non-medical: Not on file  Tobacco Use  . Smoking status: Former Smoker    Packs/day: 0.25    Quit date: 03/21/2012    Years since  quitting: 7.5  . Smokeless tobacco: Never Used  Substance and Sexual Activity  . Alcohol use: No  . Drug use: No    Types: Marijuana    Comment: 4 weeks ago  . Sexual activity: Yes    Birth control/protection: None, Condom  Lifestyle  . Physical activity    Days per week: Not on file    Minutes per session: Not on file  . Stress: Not on file  Relationships  . Social Herbalist on phone: Not on file    Gets together: Not on file    Attends religious service: Not on file    Active member of club or organization: Not on file    Attends meetings of clubs or organizations: Not on file    Relationship status: Not on file   Other Topics Concern  . Not on file  Social History Narrative  . Not on file    Vitals:   10/12/19 1135  BP: 110/60  Resp: 12  Temp: (!) 97 F (36.1 C)   Body mass index is 35.64 kg/m.   Wt Readings from Last 3 Encounters:  10/12/19 214 lb 3.2 oz (97.2 kg)  08/31/19 206 lb 2 oz (93.5 kg)  07/01/17 215 lb 4 oz (97.6 kg)    Physical Exam  Nursing note and vitals reviewed. Constitutional: She is oriented to person, place, and time. She appears well-developed. No distress.  HENT:  Head: Normocephalic and atraumatic.  Mouth/Throat: Oropharynx is clear and moist and mucous membranes are normal.  Eyes: Pupils are equal, round, and reactive to light. Conjunctivae are normal.  Cardiovascular: Normal rate and regular rhythm.  No murmur heard. Pulses:      Dorsalis pedis pulses are 2+ on the right side and 2+ on the left side.  Respiratory: Effort normal and breath sounds normal. No respiratory distress.  GI: Soft. She exhibits no mass. There is no hepatomegaly. There is no abdominal tenderness.  Musculoskeletal:        General: No edema.  Lymphadenopathy:    She has no cervical adenopathy.  Neurological: She is alert and oriented to person, place, and time. She has normal strength. No cranial nerve deficit. Gait normal.  Skin: Skin is warm. No rash noted. No erythema.  Psychiatric: Her mood appears anxious.  Well groomed, good eye contact.    ASSESSMENT AND PLAN:  Ms Behrendt was seen today for follow up.  Diagnoses and all orders for this visit:  Insomnia, unspecified type Improved with Amitriptyline but because increased appetite ,will discontinue. Good sleep hygiene. Melatonin 10 mg 1-2 hours after supper.  Headache, unspecified headache type Improved. Because wt gain, amitriptyline to be discontinued. After discussion of some side effects she agrees with trying Topamax 25-50 mg at bedtime. Instructed about warning signs. Birth control: Implant.   -      topiramate (TOPAMAX) 50 MG tablet; Take 1 tablet (50 mg total) by mouth at bedtime.  Moderate episode of recurrent major depressive disorder (HCC) Continue Prozac 20 mg daily. Educated about the risk of interaction with Topamax, we will try to keep both on low dose. Psychotherapy recommended.  -     FLUoxetine (PROZAC) 20 MG tablet; Take 1 tablet (20 mg total) by mouth daily.  Obesity, Class I, BMI 30-34.9 She would like pharmacologic treatment. We discussed options,she has heard about Phentermine. Side effects discussed. Recommend starting Phentermine 37.5 mg 1/2 tab for 1-2 weeks then increase to the whole tab as tolerated. We  discussed benefits of wt loss as well as adverse effects of obesity. Consistency with healthy diet and physical activity recommended.  -     phentermine (ADIPEX-P) 37.5 MG tablet; Take 1 tablet (37.5 mg total) by mouth daily before breakfast.  Anxiety disorder, unspecified type Not well controlled. Continue Fluoxetine 20 mg daily. Clonazepam 0.5 mg to take 1/2 tab daily as needed. Side effects discussed, recommend trying medication when she is at home,so she can monitor for side effects.  -     clonazePAM (KLONOPIN) 0.5 MG tablet; Take 0.5 tablets (0.25 mg total) by mouth daily as needed for anxiety. -     FLUoxetine (PROZAC) 20 MG tablet; Take 1 tablet (20 mg total) by mouth daily.   Return in about 4 weeks (around 11/09/2019) for wt, anxiety,depression,headache.   Burnell Matlin G. [place], MD  [facility]. [place] office.

## 2019-10-12 NOTE — Progress Notes (Signed)
28 year old SPF G2 P2 presents with complaint of vaginal discharge with white discharge and is now having a cycle.  08/2018 Nexplanon was amenorrheic last few months has had a light cycle.  Denies abdominal/back pain, urinary symptoms, nausea, vaginal odor or fever.  Same partner questions fidelity.  No known medical problems, history of gonorrhea and and chlamydia in the past.  Daughters are ages 65 and 1 both doing well.  Exam: Appears well.  No CVAT.  Abdomen obese, soft, nontender, external genitalia within normal limits, speculum exam moderate amount of menses type blood noted, wet prep positive for clues, TNTC bacteria.  Bimanual no CMT or adnexal tenderness.  GC/Chlamydia culture pending.  Bacterial vaginosis STD screen  Plan: MetroGel vaginal cream 1 applicator at bedtime x5, alcohol precautions reviewed.  Requested Diflucan  states often gets yeast infections after MetroGel.  Diflucan 150 p.o. x1 dose Rx given.  HIV, RPR, GC/chlamydia culture pending.  Condoms encouraged until permanent partner.

## 2019-10-12 NOTE — Patient Instructions (Signed)
A few things to remember from today's visit:   Insomnia, unspecified type  Moderate episode of recurrent major depressive disorder (HCC)  Obesity, Class I, BMI 30-34.9 - Plan: phentermine (ADIPEX-P) 37.5 MG tablet  Anxiety disorder, unspecified type - Plan: clonazePAM (KLONOPIN) 0.5 MG tablet  Headache, unspecified headache type - Plan: topiramate (TOPAMAX) 50 MG tablet  Amitriptyline to be discontinued due to weight gain. Topamax 50 mg 1/2 to 1 tablet at bedtime. Melatonin 10 mg 2 hours after supper.  Phentermine to start 1/2 tablet daily before breakfast, increase to the whole tablet in 10 days if well-tolerated.  For anxiety we are going to try clonazepam, which is a benzodiazepine, controlled medication. *Clonazepam 1/2 tablet daily as needed, try medication during a day you know how to work.  Please be sure medication list is accurate. If a new problem present, please set up appointment sooner than planned today.

## 2019-10-13 LAB — HIV ANTIBODY (ROUTINE TESTING W REFLEX): HIV 1&2 Ab, 4th Generation: NONREACTIVE

## 2019-10-13 LAB — RPR: RPR Ser Ql: NONREACTIVE

## 2019-10-14 LAB — TEST AUTHORIZATION

## 2019-10-14 LAB — GC PROBE AMP THINPREP: N. gonorrhoeae RNA, TMA: NOT DETECTED

## 2019-10-14 LAB — CHLAMYDIA PROBE AMP THINPREP: C. trachomatis RNA, TMA: NOT DETECTED

## 2019-10-14 NOTE — Telephone Encounter (Signed)
Patient called regarding results, I informed patient with negative results.

## 2019-10-15 ENCOUNTER — Encounter: Payer: Self-pay | Admitting: Family Medicine

## 2019-10-23 ENCOUNTER — Other Ambulatory Visit: Payer: Self-pay | Admitting: Family Medicine

## 2019-10-23 DIAGNOSIS — R519 Headache, unspecified: Secondary | ICD-10-CM

## 2019-11-14 ENCOUNTER — Ambulatory Visit: Payer: BC Managed Care – PPO | Admitting: Family Medicine

## 2019-11-14 ENCOUNTER — Other Ambulatory Visit: Payer: Self-pay

## 2019-11-15 ENCOUNTER — Encounter: Payer: Self-pay | Admitting: Family Medicine

## 2019-11-15 ENCOUNTER — Ambulatory Visit (INDEPENDENT_AMBULATORY_CARE_PROVIDER_SITE_OTHER): Payer: BC Managed Care – PPO | Admitting: Family Medicine

## 2019-11-15 VITALS — BP 120/64 | HR 72 | Temp 97.5°F | Resp 12 | Ht 65.0 in | Wt 194.8 lb

## 2019-11-15 DIAGNOSIS — G4700 Insomnia, unspecified: Secondary | ICD-10-CM | POA: Diagnosis not present

## 2019-11-15 DIAGNOSIS — F331 Major depressive disorder, recurrent, moderate: Secondary | ICD-10-CM | POA: Diagnosis not present

## 2019-11-15 DIAGNOSIS — L282 Other prurigo: Secondary | ICD-10-CM

## 2019-11-15 DIAGNOSIS — R519 Headache, unspecified: Secondary | ICD-10-CM

## 2019-11-15 DIAGNOSIS — Z6832 Body mass index (BMI) 32.0-32.9, adult: Secondary | ICD-10-CM

## 2019-11-15 DIAGNOSIS — E6609 Other obesity due to excess calories: Secondary | ICD-10-CM

## 2019-11-15 DIAGNOSIS — F419 Anxiety disorder, unspecified: Secondary | ICD-10-CM

## 2019-11-15 MED ORDER — TRIAMCINOLONE ACETONIDE 0.1 % EX CREA: 1.0000 "application " | TOPICAL_CREAM | Freq: Two times a day (BID) | CUTANEOUS | 0 refills | Status: DC

## 2019-11-15 MED ORDER — KETOCONAZOLE 2 % EX SHAM: 1.0000 "application " | MEDICATED_SHAMPOO | CUTANEOUS | 0 refills | Status: DC

## 2019-11-15 MED ORDER — METHYLPREDNISOLONE ACETATE 40 MG/ML IJ SUSP
40.0000 mg | Freq: Once | INTRAMUSCULAR | Status: AC
  Administered 2019-11-15: 40 mg via INTRAMUSCULAR

## 2019-11-15 NOTE — Assessment & Plan Note (Signed)
She lost about 18 Lb since her last visit. We discussed benefits of wt loss as well as adverse effects of obesity. Encouraged to continue with healthful diet,portion control, and physical activity recommended. Because she thinks Phentermine caused skin rash,recommend holding it for now.

## 2019-11-15 NOTE — Assessment & Plan Note (Signed)
Great improvement. Continue Topamax 50 mg daily at bedtime.

## 2019-11-15 NOTE — Progress Notes (Signed)
HPI:   Dawn Barnett is a 28 y.o. female, who is here today for chronic disease management.   She was last seen on 10/12/19. Obesity: She started Phentermine 37.5 mg daily. She is eating smaller portions and increased physical activity. She is walking 60 min daily.  She has noted wt loss.  C/O erythematous pruritic lesions noted 2 weeks ago, "hives." Lesions are constant, very pruritic. She thinks rash is caused by Phentermine. She has used OTC "anti-itching" cream. No similar problem in the past. She denies associated fever,chills,fatigue,sore throat,oral lesions.  Headache: Last visit Topamax was started and Amitriptyline was discontinued.  She is not taking Topamax daily, 5 times per week. She has not had headaches since last visit.  She improved sleep hygiene and sleeping well.    Depression and anxiety: Clonazepam 0.5 mg added last visit. She has taken medication once , it causes drowsiness , so she cannot take it when she works. She feels better. Negative for suicidal thoughts. Exercises has improved with exercise.  Review of Systems  Constitutional: Negative for activity change and appetite change.  HENT: Negative for mouth sores, nosebleeds and trouble swallowing.   Respiratory: Negative for cough, shortness of breath and wheezing.   Cardiovascular: Negative for chest pain, palpitations and leg swelling.  Gastrointestinal: Negative for abdominal pain, nausea and vomiting.       Negative for changes in bowel habits.  Musculoskeletal: Negative for arthralgias and gait problem.  Skin: Negative for pallor and wound.  Neurological: Negative for syncope, facial asymmetry, speech difficulty and weakness.  Psychiatric/Behavioral: Negative for confusion and hallucinations.  Rest of ROS, see pertinent positives sand negatives in HPI   Current Outpatient Medications on File Prior to Visit  Medication Sig Dispense Refill  . clonazePAM (KLONOPIN) 0.5  MG tablet Take 0.5 tablets (0.25 mg total) by mouth daily as needed for anxiety. 20 tablet 1  . DOXYCYCLINE HYCLATE PO Take 1 tablet by mouth daily. 1 tablet daily for acne    . etonogestrel (NEXPLANON) 68 MG IMPL implant 1 each by Subdermal route once.    Marland Kitchen FLUoxetine (PROZAC) 20 MG tablet Take 1 tablet (20 mg total) by mouth daily. 90 tablet 1  . metroNIDAZOLE (METROGEL VAGINAL) 0.75 % vaginal gel 1 applicator at bedtime for 5 nights and avoid ETOH 70 g 0  . phentermine (ADIPEX-P) 37.5 MG tablet Take 1 tablet (37.5 mg total) by mouth daily before breakfast. 30 tablet 1  . topiramate (TOPAMAX) 50 MG tablet Take 1 tablet (50 mg total) by mouth at bedtime. 30 tablet 1  . valACYclovir (VALTREX) 500 MG tablet Take 500 mg by mouth 2 (two) times daily.     No current facility-administered medications on file prior to visit.      Past Medical History:  Diagnosis Date  . Abnormal Pap smear 01/2009  . BV (bacterial vaginosis) 2010  . Dysplasia 2010  . H/O cervicitis 2010  . H/O gonorrhea 2010  . H/O varicella   . History of chlamydia 06/05/09  . HPV in female   . Irregular periods/menstrual cycles   . Itching 09/04/2009  . Pelvic pain in female 2010  . Syphilis 2006  . Weight loss    Allergies  Allergen Reactions  . Other     Social History   Socioeconomic History  . Marital status: Single    Spouse name: Not on file  . Number of children: Not on file  . Years of education: Not on  file  . Highest education level: Not on file  Occupational History  . Not on file  Social Needs  . Financial resource strain: Not on file  . Food insecurity    Worry: Not on file    Inability: Not on file  . Transportation needs    Medical: Not on file    Non-medical: Not on file  Tobacco Use  . Smoking status: Former Smoker    Packs/day: 0.25    Quit date: 03/21/2012    Years since quitting: 7.6  . Smokeless tobacco: Never Used  Substance and Sexual Activity  . Alcohol use: No  . Drug use: No     Types: Marijuana    Comment: 4 weeks ago  . Sexual activity: Yes    Birth control/protection: None, Condom  Lifestyle  . Physical activity    Days per week: Not on file    Minutes per session: Not on file  . Stress: Not on file  Relationships  . Social Musician on phone: Not on file    Gets together: Not on file    Attends religious service: Not on file    Active member of club or organization: Not on file    Attends meetings of clubs or organizations: Not on file    Relationship status: Not on file  Other Topics Concern  . Not on file  Social History Narrative  . Not on file    Vitals:   11/15/19 1125  BP: 120/64  Pulse: 72  Resp: 12  Temp: (!) 97.5 F (36.4 C)   Body mass index is 32.42 kg/m.   Wt Readings from Last 3 Encounters:  11/15/19 194 lb 12.8 oz (88.4 kg)  10/12/19 214 lb 3.2 oz (97.2 kg)  08/31/19 206 lb 2 oz (93.5 kg)   Physical Exam  Nursing note and vitals reviewed. Constitutional: She is oriented to person, place, and time. She appears well-developed. No distress.  HENT:  Head: Normocephalic and atraumatic.  Mouth/Throat: Oropharynx is clear and moist and mucous membranes are normal.  Eyes: Pupils are equal, round, and reactive to light. Conjunctivae are normal.  Cardiovascular: Normal rate and regular rhythm.  No murmur heard. Respiratory: Effort normal and breath sounds normal. No respiratory distress.  GI: Soft. She exhibits no mass. There is no hepatomegaly. There is no abdominal tenderness.  Musculoskeletal:        General: No edema.  Lymphadenopathy:    She has no cervical adenopathy.  Neurological: She is alert and oriented to person, place, and time. She has normal strength. No cranial nerve deficit. Gait normal.  Skin: Skin is warm. Rash noted. No erythema.  Raised rounded lesions scattered on abdomen,back,and upper check. Lesions are scaly, not tender, 1-2 cm.  Psychiatric: She has a normal mood and affect.  Well  groomed, good eye contact.     ASSESSMENT AND PLAN:   Ms. Dawn Barnett was seen today for chronic disease management.   Pruritic rash We discussed possible causes. Lesions do not seem urticarial. Because she thinks it is caused by Phentermine,recommend holding it for a few weeks. ? Tinea. Recommend Ketoconazole shampoo daily and to rinse after 5 minths. Triamcinolone bis prn for pruritus. Instructed about warning signs.  - ketoconazole (NIZORAL) 2 % shampoo; Apply 1 application topically 2 (two) times a week.  Dispense: 100 mL; Refill: 0  Class 1 obesity with body mass index (BMI) of 32.0 to 32.9 in adult She lost about 18  Lb since her last visit. We discussed benefits of wt loss as well as adverse effects of obesity. Encouraged to continue with healthful diet,portion control, and physical activity recommended. Because she thinks Phentermine caused skin rash,recommend holding it for now.    Anxiety disorder Problem is better controlled. Continue fluoxetine 20 mg daily. She can take the clonazepam daily prn but when she is at home or not working/driving.  Insomnia Improved. Good sleep hygiene to continue.  Headache, unspecified headache type Great improvement. Continue Topamax 50 mg daily at bedtime.  Moderate episode of recurrent major depressive disorder (HCC) Well controlled. Continue Fluoxetine 20 mg daily. Instructed about warning signs.   Return in about 3 months (around 02/13/2020) for depression,wt,headache.    Betty G. [place], MD  [facility]. [place] office.

## 2019-11-15 NOTE — Assessment & Plan Note (Addendum)
Improved. Good sleep hygiene to continue.

## 2019-11-15 NOTE — Assessment & Plan Note (Signed)
Problem is better controlled. Continue fluoxetine 20 mg daily. She can take the clonazepam daily prn but when she is at home or not working/driving.

## 2019-11-15 NOTE — Patient Instructions (Addendum)
A few things to remember from today's visit:   Insomnia, unspecified type  Headache, unspecified headache type  Obesity, Class I, BMI 30-34.9  Moderate episode of recurrent major depressive disorder (HCC)  Pruritic rash - Plan: ketoconazole (NIZORAL) 2 % shampoo  Skin lesions do not seem like urticaria. Hold on Phentermine for 2-3 weeks and monitor rash.  ? Tinea. Apply shampoo from neck down and rinse after 5 min.  Please be sure medication list is accurate. If a new problem present, please set up appointment sooner than planned today.

## 2019-11-26 ENCOUNTER — Other Ambulatory Visit: Payer: Self-pay | Admitting: Family Medicine

## 2019-11-26 DIAGNOSIS — R519 Headache, unspecified: Secondary | ICD-10-CM

## 2019-11-27 ENCOUNTER — Other Ambulatory Visit: Payer: Self-pay | Admitting: Family Medicine

## 2019-12-12 ENCOUNTER — Other Ambulatory Visit: Payer: Self-pay | Admitting: Family Medicine

## 2019-12-12 DIAGNOSIS — R519 Headache, unspecified: Secondary | ICD-10-CM

## 2019-12-12 DIAGNOSIS — L282 Other prurigo: Secondary | ICD-10-CM

## 2020-01-15 ENCOUNTER — Encounter (HOSPITAL_COMMUNITY): Payer: Self-pay

## 2020-01-15 ENCOUNTER — Ambulatory Visit (HOSPITAL_COMMUNITY)
Admission: EM | Admit: 2020-01-15 | Discharge: 2020-01-15 | Disposition: A | Discharge status: Home / Self Care | Payer: BC Managed Care – PPO | Attending: Family Medicine | Admitting: Family Medicine

## 2020-01-15 DIAGNOSIS — R300 Dysuria: Secondary | ICD-10-CM | POA: Diagnosis not present

## 2020-01-15 DIAGNOSIS — R1084 Generalized abdominal pain: Secondary | ICD-10-CM | POA: Diagnosis not present

## 2020-01-15 DIAGNOSIS — Z3202 Encounter for pregnancy test, result negative: Secondary | ICD-10-CM

## 2020-01-15 DIAGNOSIS — M545 Low back pain, unspecified: Secondary | ICD-10-CM

## 2020-01-15 LAB — POCT URINALYSIS DIP (DEVICE)
Bilirubin Urine: NEGATIVE
Glucose, UA: NEGATIVE mg/dL
Hgb urine dipstick: NEGATIVE
Leukocytes,Ua: NEGATIVE
Nitrite: NEGATIVE
Protein, ur: NEGATIVE mg/dL
Specific Gravity, Urine: 1.025 (ref 1.005–1.030)
Urobilinogen, UA: 0.2 mg/dL (ref 0.0–1.0)
pH: 7 (ref 5.0–8.0)

## 2020-01-15 LAB — POC URINE PREG, ED: Preg Test, Ur: NEGATIVE

## 2020-01-15 LAB — POCT PREGNANCY, URINE: Preg Test, Ur: NEGATIVE

## 2020-01-15 MED ORDER — PHENAZOPYRIDINE HCL 200 MG PO TABS: 200.0000 mg | ORAL_TABLET | Freq: Three times a day (TID) | ORAL | 0 refills | Status: AC

## 2020-01-15 NOTE — ED Provider Notes (Signed)
[place]    CSN: [id number] Arrival date & time: 01/15/20  1600      History   Chief Complaint Chief Complaint  Patient presents with  . Back Pain  . Abdominal Pain  . Dysuria    HPI Dawn Barnett is a 29 y.o. female.   Dawn Barnett presents with complaints of low back pain as well as low abdominal pain which started 3 days ago. No known back injury. No blood to urine. Has noted some cloudy urine and has noted some burning after urination. Only mild urinary frequency. No fevers. Denies  Any previous similar. History  Of bv but did not feel the same. No vaginal discharge or odor. LMP 1/20 which was normal for her, regular periods. She does have a nexplanon. No nausea vomiting or diarrhea. Hasn't taken any medications for her symptoms. She is on doxycycline 100mg  daily for acne. She is sexually active with 1 partner and doesn't use condoms, no known std exposure.    ROS per HPI, negative if not otherwise mentioned.      Past Medical History:  Diagnosis Date  . Abnormal Pap smear 01/2009  . BV (bacterial vaginosis) 2010  . Dysplasia 2010  . H/O cervicitis 2010  . H/O gonorrhea 2010  . H/O varicella   . History of chlamydia 06/05/09  . HPV in female   . Irregular periods/menstrual cycles   . Itching 09/04/2009  . Pelvic pain in female 2010  . Syphilis 2006  . Weight loss     Patient Active Problem List   Diagnosis Date Noted  . Headache, unspecified headache type 11/15/2019  . Anxiety disorder 07/25/2019  . Obesity, Class I, BMI 30-34.9 03/26/2017  . Major depression, recurrent ([place]) 02/12/2017  . Class 1 obesity with body mass index (BMI) of 32.0 to 32.9 in adult 02/12/2017  . Insomnia 02/12/2017  . Vaginal delivery 04/23/2014  . Gonorrhea complicating pregnancy 95/28/4132  . Smoker 09/12/2013  . Hyperemesis arising during pregnancy 09/12/2013  . Hx of syphilis 09/12/2013  . 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 Hx of chlamydia infection  09/12/2013    History reviewed. No pertinent surgical history.  OB History    Gravida  2   Para  2   Term  2   Preterm      AB      Living  2     SAB      TAB      Ectopic      Multiple      Live Births  2            Home Medications    Prior to Admission medications   Medication Sig Start Date End Date Taking? Authorizing Provider  clonazePAM (KLONOPIN) 0.5 MG tablet Take 0.5 tablets (0.25 mg total) by mouth daily as needed for anxiety. 10/12/19   [place], Betty G, MD  DOXYCYCLINE HYCLATE PO Take 1 tablet by mouth daily. 1 tablet daily for acne    [provider]  etonogestrel (NEXPLANON) 68 MG IMPL implant 1 each by Subdermal route once.    [provider]  FLUoxetine (PROZAC) 20 MG tablet Take 1 tablet (20 mg total) by mouth daily. 10/12/19   [place], Betty G, MD  metroNIDAZOLE (METROGEL VAGINAL) 0.75 % vaginal gel 1 applicator at bedtime for 5 nights and avoid ETOH 10/12/19   Huel Cote, NP  phenazopyridine (PYRIDIUM) 200 MG tablet Take 1 tablet (200 mg total) by mouth 3 (  three) times daily. 01/15/20   Georgetta Haber, NP  phentermine (ADIPEX-P) 37.5 MG tablet Take 1 tablet (37.5 mg total) by mouth daily before breakfast. 10/12/19   [place], Betty G, MD  topiramate (TOPAMAX) 50 MG tablet TAKE 1 TABLET BY MOUTH EVERYDAY AT BEDTIME 12/12/19   [place], Betty G, MD  triamcinolone cream (KENALOG) 0.1 % APPLY TO AFFECTED AREA TWICE A DAY 11/28/19   [place], Betty G, MD  valACYclovir (VALTREX) 500 MG tablet Take 500 mg by mouth 2 (two) times daily.    [provider]    Family History Family History  Problem Relation Age of Onset  . Drug abuse Father   . Alcohol abuse Father   . COPD Maternal Grandmother        Emphysema    Social History Social History   Tobacco Use  . Smoking status: Former Smoker    Packs/day: 0.25    Quit date: 03/21/2012    Years since quitting: 7.8  . Smokeless tobacco: Never Used  Substance Use Topics    . Alcohol use: No  . Drug use: No    Types: Marijuana    Comment: 4 weeks ago     Allergies   Other   Review of Systems Review of Systems   Physical Exam Triage Vital Signs ED Triage Vitals  Enc Vitals Group     BP 01/15/20 1621 126/77     Pulse Rate 01/15/20 1621 88     Resp 01/15/20 1621 16     Temp 01/15/20 1621 99 F (37.2 C)     Temp src --      SpO2 01/15/20 1621 100 %     Weight --      Height --      Head Circumference --      Peak Flow --      Pain Score 01/15/20 1617 8     Pain Loc --      Pain Edu? --      Excl. in GC? --    No data found.  Updated Vital Signs BP 126/77 (BP Location: Right Arm)   Pulse 88   Temp 99 F (37.2 C)   Resp 16   LMP 01/04/2020 (Exact Date)   SpO2 100%    Physical Exam Constitutional:      General: She is not in acute distress.    Appearance: She is well-developed.  Cardiovascular:     Rate and Rhythm: Normal rate.  Pulmonary:     Effort: Pulmonary effort is normal.  Abdominal:     Tenderness: There is generalized abdominal tenderness. There is no guarding or rebound.  Musculoskeletal:     Lumbar back: Tenderness present. No bony tenderness. Normal range of motion.     Comments: Very mild generalized low back pain; strength equal bilaterally; gross sensation intact; no step off or deformity to spinous processes; ambulatory without difficulty  Skin:    General: Skin is warm and dry.  Neurological:     Mental Status: She is alert and oriented to person, place, and time.      UC Treatments / Results  Labs (all labs ordered are listed, but only abnormal results are displayed) Labs Reviewed  POCT URINALYSIS DIP (DEVICE) - Abnormal; Notable for the following components:      Result Value   Ketones, ur TRACE (*)    All other components within normal limits  URINE CULTURE  POCT PREGNANCY, URINE  POC URINE PREG, ED  CERVICOVAGINAL ANCILLARY ONLY    EKG   Radiology No results  found.  Procedures Procedures (including critical care time)  Medications Ordered in UC Medications - No data to display  Initial Impression / Assessment and Plan / UC Course  I have reviewed the triage vital signs and the nursing notes.  Pertinent labs & imaging results that were available during my care of the patient were reviewed by me and considered in my medical decision making (see chart for details).     Non specific abdominal exam. Denies vaginal complaints, exam deferred. Abdominal exam is generalized without specific pelvic pain. Trace ketones to urine without glucose, leukocytes, hgb or nitrite. Symptoms of uti however with burning, culture obtained and pending. Encouraged increased water intake. Pyridium provided. Vaginal cytology self swab collected and pending. Back pain is reproducible with palpation, lower suspicion for referred abdominal pain, and suspect musculoskeletal pain. Return precautions provided. If symptoms worsen or do not improve in the next week to return to be seen or to follow up with PCP.  Patient verbalized understanding and agreeable to plan.   Final Clinical Impressions(s) / UC Diagnoses   Final diagnoses:  Acute midline low back pain without sciatica  Generalized abdominal pain  Dysuria     Discharge Instructions     Your urine does not indicate urinary tract infection today.  I have sent it to be cultured to confirm this.  We are also testing your vagina to ensure no source of infection there.  Will notify of any positive findings from these tests and if any changes to treatment are needed.   Increase your water intake, limit caffeine or alcohol.  Pyridium as needed for burning, no more than for 3 days. This will cause your urine to turn an orange color.  If symptoms worsen or do not improve in the next week to return to be seen or to follow up with your PCP.     ED Prescriptions    Medication Sig Dispense Auth. Provider   phenazopyridine  (PYRIDIUM) 200 MG tablet Take 1 tablet (200 mg total) by mouth 3 (three) times daily. 6 tablet Georgetta Haber, NP     PDMP not reviewed this encounter.   Georgetta Haber, NP 01/15/20 1706

## 2020-01-15 NOTE — Discharge Instructions (Signed)
Your urine does not indicate urinary tract infection today.  I have sent it to be cultured to confirm this.  We are also testing your vagina to ensure no source of infection there.  Will notify of any positive findings from these tests and if any changes to treatment are needed.   Increase your water intake, limit caffeine or alcohol.  Pyridium as needed for burning, no more than for 3 days. This will cause your urine to turn an orange color.  If symptoms worsen or do not improve in the next week to return to be seen or to follow up with your PCP.

## 2020-01-15 NOTE — ED Triage Notes (Addendum)
Pt presents to UC with lower back pain, lower abdominal pain and burning sensation when urinating x 3 days. Pt requested STD's test.

## 2020-01-16 LAB — URINE CULTURE: Culture: 60000 — AB

## 2020-01-17 LAB — CERVICOVAGINAL ANCILLARY ONLY
Bacterial vaginitis: NEGATIVE
Candida vaginitis: NEGATIVE
Chlamydia: NEGATIVE
Neisseria Gonorrhea: NEGATIVE
Trichomonas: NEGATIVE

## 2020-03-21 ENCOUNTER — Ambulatory Visit (INDEPENDENT_AMBULATORY_CARE_PROVIDER_SITE_OTHER): Payer: BC Managed Care – PPO | Admitting: Women's Health

## 2020-03-21 ENCOUNTER — Encounter: Payer: Self-pay | Admitting: Women's Health

## 2020-03-21 ENCOUNTER — Other Ambulatory Visit: Payer: Self-pay

## 2020-03-21 VITALS — BP 124/80 | Ht 65.0 in | Wt 198.0 lb

## 2020-03-21 DIAGNOSIS — Z01419 Encounter for gynecological examination (general) (routine) without abnormal findings: Secondary | ICD-10-CM

## 2020-03-21 MED ORDER — BETAMETHASONE VALERATE 0.1 % EX CREA: TOPICAL_CREAM | Freq: Two times a day (BID) | CUTANEOUS | 1 refills | Status: AC

## 2020-03-21 MED ORDER — VALACYCLOVIR HCL 500 MG PO TABS: 500.0000 mg | ORAL_TABLET | Freq: Two times a day (BID) | ORAL | 12 refills | Status: AC

## 2020-03-21 NOTE — Progress Notes (Signed)
Dawn Barnett Apr 18, 1991 [id number]    History:    Presents for annual exam.  Rare cycles on Nexplanon placed 08/2018.  Same partner with negative STD screen.  Labs at primary care.  History of an abnormal Pap requiring recheck no treatment.  Has had Gardasil.  HSV rare outbreaks.   Past medical history, past surgical history, family history and social history were all reviewed and documented in the EPIC chart.  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  Daughters are 5 and 9 both doing well.  History of gonorrhea, chlamydia, syphilis, treated with negative test to cure.  ROS:  A ROS was performed and pertinent positives and negatives are included.  Exam:  Vitals:   03/21/20 1454  BP: 124/80  Weight: 198 lb (89.8 kg)  Height: 5\' 5"  (1.651 m)   Body mass index is 32.95 kg/m.   General appearance:  Normal Thyroid:  Symmetrical, normal in size, without palpable masses or nodularity. Respiratory  Auscultation:  Clear without wheezing or rhonchi Cardiovascular  Auscultation:  Regular rate, without rubs, murmurs or gallops  Edema/varicosities:  Not grossly evident Abdominal  Soft,nontender, without masses, guarding or rebound.  Liver/spleen:  No organomegaly noted  Hernia:  None appreciated  Skin  Inspection:  Grossly normal   Breasts: Examined lying and sitting.     Right: Without masses, retractions, discharge or axillary adenopathy.     Left: Without masses, retractions, discharge or axillary adenopathy. Gentitourinary   Inguinal/mons:  Normal without inguinal adenopathy  External genitalia:  Normal  BUS/Urethra/Skene's glands:  Normal  Vagina:  Normal  Cervix:  Normal  Uterus:  normal in size, shape and contour.  Midline and mobile  Adnexa/parametria:     Rt: Without masses or tenderness.   Lt: Without masses or tenderness.  Anus and perineum: Normal   Assessment/Plan:  29 y.o. S BF G2 P2 for annual exam with no complaints of discharge, urinary  symptoms, dyspareunia or abdominal pain.  08/2018 Nexplanon rare bleeding  HSV Labs-primary care  Plan: Aware Nexplanon is good for 3 years, condoms encouraged until permanent partner.  SBEs, exercise, calcium rich foods, MVI daily encouraged.  Reports has lost about 30 pounds in the past year the past year with diet and exercise congratulations given will continue healthy lifestyle.  Valtrex 500 twice daily for 3 to 5 days as needed, prescription given.  Valisone 0.1% apply twice daily for several days to areas of eczema, follow-up with dermatologist if no relief.  Pap.   09/2018 [facility], 3:25 PM 03/21/2020

## 2020-03-21 NOTE — Addendum Note (Signed)
Addended by: Tito Dine on: 03/21/2020 03:51 PM   Modules accepted: Orders

## 2020-03-21 NOTE — Patient Instructions (Addendum)
Vit D 1000 iu  Pleasure knowing you! Health Maintenance, Female Adopting a healthy lifestyle and getting preventive care are important in promoting health and wellness. Ask your health care provider about:  The right schedule for you to have regular tests and exams.  Things you can do on your own to prevent diseases and keep yourself healthy. What should I know about diet, weight, and exercise? Eat a healthy diet   Eat a diet that includes plenty of vegetables, fruits, low-fat dairy products, and lean protein.  Do not eat a lot of foods that are high in solid fats, added sugars, or sodium. Maintain a healthy weight Body mass index (BMI) is used to identify weight problems. It estimates body fat based on height and weight. Your health care provider can help determine your BMI and help you achieve or maintain a healthy weight. Get regular exercise Get regular exercise. This is one of the most important things you can do for your health. Most adults should:  Exercise for at least 150 minutes each week. The exercise should increase your heart rate and make you sweat (moderate-intensity exercise).  Do strengthening exercises at least twice a week. This is in addition to the moderate-intensity exercise.  Spend less time sitting. Even light physical activity can be beneficial. Watch cholesterol and blood lipids Have your blood tested for lipids and cholesterol at 30 years of age, then have this test every 5 years. Have your cholesterol levels checked more often if:  Your lipid or cholesterol levels are high.  You are older than 30 years of age.  You are at high risk for heart disease. What should I know about cancer screening? Depending on your health history and family history, you may need to have cancer screening at various ages. This may include screening for:  Breast cancer.  Cervical cancer.  Colorectal cancer.  Skin cancer.  Lung cancer. What should I know about heart  disease, diabetes, and high blood pressure? Blood pressure and heart disease  High blood pressure causes heart disease and increases the risk of stroke. This is more likely to develop in people who have high blood pressure readings, are of African descent, or are overweight.  Have your blood pressure checked: ? Every 3-5 years if you are 30-64 years of age. ? Every year if you are 30 years old or older. Diabetes Have regular diabetes screenings. This checks your fasting blood sugar level. Have the screening done:  Once every three years after age 30 if you are at a normal weight and have a low risk for diabetes.  More often and at a younger age if you are overweight or have a high risk for diabetes. What should I know about preventing infection? Hepatitis B If you have a higher risk for hepatitis B, you should be screened for this virus. Talk with your health care provider to find out if you are at risk for hepatitis B infection. Hepatitis C Testing is recommended for:  Everyone born from [birth year] through 1966.  Anyone with known risk factors for hepatitis C. Sexually transmitted infections (STIs)  Get screened for STIs, including gonorrhea and chlamydia, if: ? You are sexually active and are younger than 30 years of age. ? You are older than 30 years of age and your health care provider tells you that you are at risk for this type of infection. ? Your sexual activity has changed since you were last screened, and you are at increased risk for chlamydia  or gonorrhea. Ask your health care provider if you are at risk.  Ask your health care provider about whether you are at high risk for HIV. Your health care provider may recommend a prescription medicine to help prevent HIV infection. If you choose to take medicine to prevent HIV, you should first get tested for HIV. You should then be tested every 3 months for as long as you are taking the medicine. Pregnancy  If you are about to stop  having your period (premenopausal) and you may become pregnant, seek counseling before you get pregnant.  Take 400 to 800 micrograms (mcg) of folic acid every day if you become pregnant.  Ask for birth control (contraception) if you want to prevent pregnancy. Osteoporosis and menopause Osteoporosis is a disease in which the bones lose minerals and strength with aging. This can result in bone fractures. If you are 30 years old or older, or if you are at risk for osteoporosis and fractures, ask your health care provider if you should:  Be screened for bone loss.  Take a calcium or vitamin D supplement to lower your risk of fractures.  Be given hormone replacement therapy (HRT) to treat symptoms of menopause. Follow these instructions at home: Lifestyle  Do not use any products that contain nicotine or tobacco, such as cigarettes, e-cigarettes, and chewing tobacco. If you need help quitting, ask your health care provider.  Do not use street drugs.  Do not share needles.  Ask your health care provider for help if you need support or information about quitting drugs. Alcohol use  Do not drink alcohol if: ? Your health care provider tells you not to drink. ? You are pregnant, may be pregnant, or are planning to become pregnant.  If you drink alcohol: ? Limit how much you use to 0-1 drink a day. ? Limit intake if you are breastfeeding.  Be aware of how much alcohol is in your drink. In the [place] one drink equals one 12 oz bottle of beer (355 mL), one 5 oz glass of wine (148 mL), or one 1 oz glass of hard liquor (44 mL). General instructions  Schedule regular health, dental, and eye exams.  Stay current with your vaccines.  Tell your health care provider if: ? You often feel depressed. ? You have ever been abused or do not feel safe at home. Summary  Adopting a healthy lifestyle and getting preventive care are important in promoting health and wellness.  Follow your health  care provider's instructions about healthy diet, exercising, and getting tested or screened for diseases.  Follow your health care provider's instructions on monitoring your cholesterol and blood pressure. This information is not intended to replace advice given to you by your health care provider. Make sure you discuss any questions you have with your health care provider. Document Revised: 11/24/2018 Document Reviewed: 11/24/2018 Elsevier Patient Education  2020 [employer].

## 2020-03-22 LAB — PAP IG W/ RFLX HPV ASCU

## 2020-04-07 ENCOUNTER — Other Ambulatory Visit: Payer: Self-pay | Admitting: Family Medicine

## 2020-04-07 DIAGNOSIS — F419 Anxiety disorder, unspecified: Secondary | ICD-10-CM

## 2020-04-07 DIAGNOSIS — F331 Major depressive disorder, recurrent, moderate: Secondary | ICD-10-CM
